# Patient Record
Sex: Male | Born: 1960 | Race: White | Hispanic: No | Marital: Married | Smoking: Never smoker
Health system: Southern US, Community
[De-identification: ages and names within clinical notes are randomized; demographics above are authoritative.]

## PROBLEM LIST (undated history)

## (undated) DIAGNOSIS — I639 Cerebral infarction, unspecified: Secondary | ICD-10-CM

## (undated) DIAGNOSIS — Z8719 Personal history of other diseases of the digestive system: Secondary | ICD-10-CM

## (undated) DIAGNOSIS — Z87442 Personal history of urinary calculi: Secondary | ICD-10-CM

## (undated) HISTORY — PX: MOUTH SURGERY: SHX715

## (undated) HISTORY — DX: Personal history of other diseases of the digestive system: Z87.19

## (undated) HISTORY — DX: Cerebral infarction, unspecified: I63.9

## (undated) HISTORY — PX: LITHOTRIPSY: SUR834

## (undated) HISTORY — DX: Personal history of urinary calculi: Z87.442

---

## 2000-04-11 ENCOUNTER — Encounter: Payer: Self-pay | Admitting: Urology

## 2000-04-11 ENCOUNTER — Encounter: Admission: RE | Admit: 2000-04-11 | Discharge: 2000-04-11 | Payer: Self-pay | Admitting: Urology

## 2000-04-29 ENCOUNTER — Encounter: Payer: Self-pay | Admitting: Urology

## 2000-04-29 ENCOUNTER — Encounter: Admission: RE | Admit: 2000-04-29 | Discharge: 2000-04-29 | Payer: Self-pay | Admitting: Urology

## 2000-04-30 ENCOUNTER — Ambulatory Visit (HOSPITAL_COMMUNITY): Admission: RE | Admit: 2000-04-30 | Discharge: 2000-04-30 | Payer: Self-pay | Admitting: Urology

## 2003-10-27 ENCOUNTER — Emergency Department (HOSPITAL_COMMUNITY): Admission: EM | Admit: 2003-10-27 | Discharge: 2003-10-27 | Payer: Self-pay | Admitting: Emergency Medicine

## 2007-02-26 ENCOUNTER — Encounter: Admission: RE | Admit: 2007-02-26 | Discharge: 2007-02-26 | Payer: Self-pay | Admitting: Internal Medicine

## 2011-01-05 NOTE — Op Note (Signed)
[facility]  Patient:    Alan, Dudley                        MRN: [id number] Proc. Date: 04/30/00 Adm. Date:  [id number] Disc. Date: [id number] Attending:  Londell Moh CC:         [name], M.D.   Operative Report  PREOPERATIVE DIAGNOSIS:  Distal left ureteral calculus.  POSTOPERATIVE DIAGNOSIS:  Distal left ureteral calculus.  OPERATION PERFORMED:  Cystoscopy, left retrograde with interpretation, left ureteroscopy, left basket extraction, left double-J catheter insertion.  SURGEON:  [name], M.D.  ANESTHESIA:  General.  COMPLICATIONS:  None.  DRAINS:  6 French x 26 cm double-J catheter.  INDICATIONS FOR PROCEDURE:  This 50 year old male has what appears to be a small stone in the distal left ureter causing high grade obstruction and pain. The patient is now to undergo ureteroscopic extraction.  She understands the risks and benefits of the procedure having had a previous procedure in the past and gave full and informed consent.  DESCRIPTION OF PROCEDURE:  After successful induction of general anesthesia, the patient was placed in the dorsal lithotomy position, prepped with Betadine and draped in the usual sterile fashion.  Cystoscopy was performed and the urethra was visualized in its entire extent and found to be normal.  Beyond the veru montanum the prostate was visualized.  It was not enlarged.  The bladder itself was carefully inspected.  It was free of any tumor or stones. Both uteral orifices were normal in configuration and location.  Attempted left retrograde study was unsuccessful because the stone was impacted right within the intramural ureter and the ureteral catheter could not be passed beyond that.  Attempts to pass this did raise a small flap of mucosal tissue. The ureteroscope was then inserted and passed into the ureteral orifice where the true lumen was identified.  A guide wire was passed up to the  kidney.  A ureteral catheter was passed over this and retrograde studies showed hydronephrosis and what appeared to be a filling defect in the distal ureter. The distal ureter was then dilated with a balloon dilator.  When this was taken down, the ureteroscope was reinserted and the stone was visualized.  The stone was extracted and will be sent for stone analysis.  The ureteroscope was reinserted and the entire ureter up to the ureteropelvic junction was visualized with no other stone material identified.  The ureteroscope was removed.  A sheath was passed over the guide wire for stabilization.  Using fluoroscopic control, a 6 [place] by 26 cm double-J catheter was passed up into the kidney with a string attached.  The cystoscope was then reinserted to visualize the stent which was well positioned and was further positioned by pulling on the string.  It coiled normally in the pelvis as well as within the bladder.  The bladder was drained.  The patient tolerated the procedure well and was taken to the recovery room in good condition.  He will be given a prescription for Tylox to take for pain, Uromax for any burning or spasms and Septra DS one daily.  He will be instructed to pull out the stent in 48 to 72 hours or return to office in follow-up in approximately two weeks. DD:  04/30/00 TD:  05/02/00 Job: [id number]

## 2014-04-06 ENCOUNTER — Encounter (INDEPENDENT_AMBULATORY_CARE_PROVIDER_SITE_OTHER): Payer: Self-pay

## 2014-04-06 ENCOUNTER — Ambulatory Visit
Admission: RE | Admit: 2014-04-06 | Discharge: 2014-04-06 | Disposition: A | Discharge status: Home / Self Care | Payer: 59 | Source: Ambulatory Visit | Attending: Internal Medicine | Admitting: Internal Medicine

## 2014-04-06 ENCOUNTER — Other Ambulatory Visit: Payer: Self-pay | Admitting: Internal Medicine

## 2014-04-06 DIAGNOSIS — G501 Atypical facial pain: Secondary | ICD-10-CM

## 2014-04-14 ENCOUNTER — Encounter: Payer: Self-pay | Admitting: Neurology

## 2014-04-15 ENCOUNTER — Ambulatory Visit (INDEPENDENT_AMBULATORY_CARE_PROVIDER_SITE_OTHER): Payer: 59 | Admitting: Neurology

## 2014-04-15 ENCOUNTER — Encounter: Payer: Self-pay | Admitting: Neurology

## 2014-04-15 VITALS — BP 132/82 | HR 64 | Ht 67.5 in | Wt 214.0 lb

## 2014-04-15 DIAGNOSIS — G501 Atypical facial pain: Secondary | ICD-10-CM | POA: Insufficient documentation

## 2014-04-15 MED ORDER — CARBAMAZEPINE 200 MG PO TABS: 100.0000 mg | ORAL_TABLET | Freq: Two times a day (BID) | ORAL | Status: DC

## 2014-04-15 MED ORDER — HYDROCODONE-ACETAMINOPHEN 5-325 MG PO TABS: 1.0000 | ORAL_TABLET | Freq: Four times a day (QID) | ORAL | Status: DC | PRN

## 2014-04-15 NOTE — Patient Instructions (Signed)
Overall you are doing fairly well but I do want to suggest a few things today:   Remember to drink plenty of fluid, eat healthy meals and do not skip any meals. Try to eat protein with a every meal and eat a healthy snack such as fruit or nuts in between meals. Try to keep a regular sleep-wake schedule and try to exercise daily, particularly in the form of walking, 20-30 minutes a day, if you can.   As far as your medications are concerned, I would like to suggest the following: 1)Please start Tegretol  (1/2 tab) twice a day  2)Please try to decrease the amount of Ibuprofen you are taking on a daily basis 3)Continue to use pain medication as needed for symptomatic relief  Please have some blood work checked today.  Follow up as needed. Please call us with any interim questions, concerns, problems, updates or refill requests.   My clinical assistant and will answer any of your questions and relay your messages to me and also relay most of my messages to you.   Our phone number is [phone]. We also have an after hours call service for urgent matters and there is a physician on-call for urgent questions. For any emergencies you know to call 911 or go to the nearest emergency room

## 2014-04-15 NOTE — Progress Notes (Signed)
[facility]    Provider:  Dr Janann Colonel Referring Provider: Izora Gala, MD Primary Care Physician:  Horton Finer, MD  CC:  Facial pain  HPI:  Alan Dudley is a 53 y.o. male here as a referral from Dr. Constance Holster for facial pain  Symptoms started around 3 weeks ago, involves the left side of his face. Started as some discomfort in his upper jaw. Got progressively worse. Went to see dentist, told everything was ok. Saw PCP, was given amoxicillin and tramadol with no benefit. Had CT head which showed mild sinusitis. Started on levaquin and hydrocodone for the pain. No improvement. Referred to ENT, told he does not have a sinus infection and was referred to neurology.   Notes symptoms are now occuring more frequently. Notes symptoms build slowly and then will get progressively worse, Will last 15 to 45 minutes, typically occuring 6 to 7 times a day. Described as a aching pain. Mainly located in left temporal, maxillary region, down to lower jaw, pain behind his left eye. No conjunctival injection, no rhinorrhea, no nasal congestion. Notes breathing in air causes symptoms to flare up. Worse with eating. No ear pain, no hearing loss.   No recent tick bites, no rashes.   Review of Systems: Out of a complete 14 system review, the patient complains of only the following symptoms, and all other reviewed systems are negative. + eye pain, headache  History   Social History  . Marital Status: Married    Spouse Name: Lattie Haw     Number of Children: 2  . Years of Education: 12+   Occupational History  .  [employer]   Social History Main Topics  . Smoking status: Never Smoker   . Smokeless tobacco: Never Used  . Alcohol Use: Yes     Comment: SOCIAL   . Drug Use: No  . Sexual Activity: Not on file   Other Topics Concern  . Not on file   Social History Narrative   Patient lives at home with wife.    Patient has 2 sons.    Patient has 2 years of college.    Patient is  right handed.   patient works at [employer]    Family History  Problem Relation Age of Onset  . Alzheimer's disease Father   . Heart block Father   . Hearing loss Mother   . Hypertension Mother   . Hypertension Father   . Migraines Son   . Stroke Father     Past Medical History  Diagnosis Date  . Personal history of other diseases of digestive system   . Personal history of urinary calculi     Past Surgical History  Procedure Laterality Date  . Lithotripsy    . Mouth surgery      Current Outpatient Prescriptions  Medication Sig Dispense Refill  . HYDROcodone-acetaminophen (NORCO/VICODIN) 5-325 MG per tablet Take 1 tablet by mouth every 6 (six) hours as needed for moderate pain (1-2 TABS BY MOUTH EVERY 6 HOURS AS NEEDED).      . Ibuprofen (MOTRIN PO) Take by mouth.      . Multiple Vitamin (MULTI-VITAMIN PO) Take by mouth.      . traMADol (ULTRAM) 50 MG tablet 50 mg.       No current facility-administered medications for this visit.    Allergies as of 04/15/2014 - Review Complete 04/15/2014  Allergen Reaction Noted  . Bacitracin  04/14/2014    Vitals: BP 132/82  Pulse 64  Ht 5' 7.5" (1.715 m)  Wt 214 lb (97.07 kg)  BMI 33.00 kg/m2 Last Weight:  Wt Readings from Last 1 Encounters:  04/15/14 214 lb (97.07 kg)   Last Height:   Ht Readings from Last 1 Encounters:  04/15/14 5' 7.5" (1.715 m)     Physical exam: Exam: Gen: NAD, conversant Eyes: anicteric sclerae, moist conjunctivae HENT: Atraumatic, oropharynx clear Neck: Trachea midline; supple,  Lungs: CTA, no wheezing, rales, rhonic                          CV: RRR, no MRG Abdomen: Soft, non-tender;  Extremities: No peripheral edema  Skin: Normal temperature, no rash,  Psych: Appropriate affect, pleasant  Neuro: MS: AA&Ox3, appropriately interactive, normal affect   Attention: WORLD backwards  Speech: fluent w/o paraphasic error  Memory: good recent and remote recall  CN: PERRL, unable to fully  visualize fundus due to pupil size, EOMI no nystagmus, no ptosis, sensation intact to LT V1-V3 bilat, face symmetric, no weakness, hearing grossly intact, palate elevates symmetrically, shoulder shrug 5/5 bilat,  tongue protrudes midline, no fasiculations noted.  Motor: normal bulk and tone Strength: 5/5  In all extremities  Coord: rapid alternating and point-to-point (FNF, HTS) movements intact.  Reflexes: symmetrical, bilat downgoing toes  Sens: LT intact in all extremities  Gait: posture, stance, stride and arm-swing normal. Tandem gait intact. Able to walk on heels and toes. Romberg absent.   Assessment:  After physical and neurologic examination, review of laboratory studies, imaging, neurophysiology testing and pre-existing records, assessment will be reviewed on the problem list.  Plan:  Treatment plan and additional workup will be reviewed under Problem List.  1)Atypical facial pain  53y/o gentleman presenting for initial evaluation of left sided facial pain. Unclear etiology of atypical facial pain. Would consider trigeminal neuralgia though duration of pain and slow progression to maximal severity is not typical. Unilateral onset and duration/frequency raises question of a trigeminal autonomic cephalgia, specifically paroxysmal hemicrania though he does not note any autonomic symptoms. Will check Lyme, ESR, CRP and ACE level. Can consider MRI/A brain if symptoms persist. Will start tegretol 133m BID, plan to titrate up as tolerated. Follow up once lab work completed.   PJim Like DO  [facility] [address] [address] Phone [phone] [phone]

## 2014-04-16 LAB — LYME, TOTAL AB TEST/REFLEX

## 2014-04-16 LAB — SEDIMENTATION RATE: Sed Rate: 2 mm/hr (ref 0–30)

## 2014-04-16 LAB — ANGIOTENSIN CONVERTING ENZYME: Angio Convert Enzyme: 28 U/L (ref 14–82)

## 2014-04-16 LAB — C-REACTIVE PROTEIN: CRP: 1.8 mg/L (ref 0.0–4.9)

## 2014-04-20 ENCOUNTER — Telehealth: Payer: Self-pay | Admitting: Neurology

## 2014-04-20 NOTE — Telephone Encounter (Signed)
Patient returning call to [facility] regarding lab results, please call patient back and advise.

## 2014-04-22 ENCOUNTER — Other Ambulatory Visit: Payer: Self-pay | Admitting: Neurology

## 2014-04-22 NOTE — Telephone Encounter (Signed)
Returned call. Discussed plan going forward. Patient has noted an excellent response to tegretol. Will continue on for 2 more weeks. If no further exacerbations will plan to taper off at that time.

## 2014-04-22 NOTE — Telephone Encounter (Signed)
Patient calling to state he received his results but he wants to know what the next step is, whether he needs a follow up visit, etc. Please call and advise.

## 2014-05-05 ENCOUNTER — Telehealth: Payer: Self-pay | Admitting: Neurology

## 2014-05-05 NOTE — Telephone Encounter (Signed)
Please have him go to 1/2 tablet ( ) nightly for 2 weeks and then discontinue. Thanks.

## 2014-05-05 NOTE — Telephone Encounter (Signed)
I called back and spoke with the patient.  Alan Dudley he has not had any exacerbations, and is doing quite well.  He would like to know if he can taper CBZ.  Please advise.  Thank you.

## 2014-05-05 NOTE — Telephone Encounter (Signed)
Patient calling as instructed by Dr. Hosie Poisson to discuss lowing dosage for carbamazepine (TEGRETOL) 200 MG tablet.  Please call anytime and may leave detailed message if not available.

## 2014-05-05 NOTE — Telephone Encounter (Signed)
I called back.  Relayed Dr Minus Breeding message.  He verbalized understanding and will proceed with taper.  Patient will call us back if anything further is needed.

## 2014-05-11 ENCOUNTER — Telehealth: Payer: Self-pay | Admitting: Neurology

## 2014-05-11 NOTE — Telephone Encounter (Signed)
Spoke to patient and he relayed that he has had a fever since last night, anywhere from 99.8 - 102 F .   He stated that he is now on one half a Tegretol daily and was told to call if he developed a fever.  He can be reached at [phone].

## 2014-05-11 NOTE — Telephone Encounter (Signed)
Returned call. Counseled patient that his fever appears to be viral and I do not suspect it is related to his tegretol. He will continue to taper off the tegretol.

## 2014-05-11 NOTE — Telephone Encounter (Signed)
Noted  

## 2014-06-18 ENCOUNTER — Telehealth: Payer: Self-pay | Admitting: Neurology

## 2014-06-18 NOTE — Telephone Encounter (Signed)
Spoke with patient to schedule follow up appointment, felt appointment wasn't needed at this time.  In the future if he needs to come back he would like to be assigned to Dr. Anne HahnWillis.

## 2014-12-27 ENCOUNTER — Inpatient Hospital Stay (HOSPITAL_COMMUNITY)
Admission: EM | Admit: 2014-12-27 | Discharge: 2014-12-30 | DRG: 066 | Disposition: A | Discharge status: Home / Self Care | Payer: BLUE CROSS/BLUE SHIELD | Attending: Neurology | Admitting: Neurology

## 2014-12-27 ENCOUNTER — Emergency Department (HOSPITAL_COMMUNITY): Payer: BLUE CROSS/BLUE SHIELD

## 2014-12-27 ENCOUNTER — Encounter (HOSPITAL_COMMUNITY): Payer: Self-pay | Admitting: Emergency Medicine

## 2014-12-27 ENCOUNTER — Inpatient Hospital Stay (HOSPITAL_COMMUNITY): Payer: BLUE CROSS/BLUE SHIELD

## 2014-12-27 DIAGNOSIS — E538 Deficiency of other specified B group vitamins: Secondary | ICD-10-CM | POA: Diagnosis not present

## 2014-12-27 DIAGNOSIS — Z823 Family history of stroke: Secondary | ICD-10-CM

## 2014-12-27 DIAGNOSIS — E785 Hyperlipidemia, unspecified: Secondary | ICD-10-CM | POA: Diagnosis present

## 2014-12-27 DIAGNOSIS — Z79899 Other long term (current) drug therapy: Secondary | ICD-10-CM

## 2014-12-27 DIAGNOSIS — E669 Obesity, unspecified: Secondary | ICD-10-CM | POA: Diagnosis present

## 2014-12-27 DIAGNOSIS — Z791 Long term (current) use of non-steroidal anti-inflammatories (NSAID): Secondary | ICD-10-CM | POA: Diagnosis not present

## 2014-12-27 DIAGNOSIS — I6789 Other cerebrovascular disease: Secondary | ICD-10-CM | POA: Diagnosis not present

## 2014-12-27 DIAGNOSIS — I613 Nontraumatic intracerebral hemorrhage in brain stem: Principal | ICD-10-CM | POA: Diagnosis present

## 2014-12-27 DIAGNOSIS — Z79891 Long term (current) use of opiate analgesic: Secondary | ICD-10-CM

## 2014-12-27 DIAGNOSIS — H532 Diplopia: Secondary | ICD-10-CM | POA: Diagnosis present

## 2014-12-27 DIAGNOSIS — Z8249 Family history of ischemic heart disease and other diseases of the circulatory system: Secondary | ICD-10-CM | POA: Diagnosis not present

## 2014-12-27 DIAGNOSIS — Z888 Allergy status to other drugs, medicaments and biological substances status: Secondary | ICD-10-CM

## 2014-12-27 DIAGNOSIS — Z6832 Body mass index (BMI) 32.0-32.9, adult: Secondary | ICD-10-CM

## 2014-12-27 DIAGNOSIS — I619 Nontraumatic intracerebral hemorrhage, unspecified: Secondary | ICD-10-CM | POA: Diagnosis present

## 2014-12-27 LAB — CBC WITH DIFFERENTIAL/PLATELET
Basophils Absolute: 0.1 K/uL (ref 0.0–0.1)
Basophils Relative: 1 % (ref 0–1)
Eosinophils Absolute: 0.1 K/uL (ref 0.0–0.7)
Eosinophils Relative: 2 % (ref 0–5)
HCT: 45.4 % (ref 39.0–52.0)
Hemoglobin: 15.5 g/dL (ref 13.0–17.0)
Lymphocytes Relative: 30 % (ref 12–46)
Lymphs Abs: 2 K/uL (ref 0.7–4.0)
MCH: 26.9 pg (ref 26.0–34.0)
MCHC: 34.1 g/dL (ref 30.0–36.0)
MCV: 78.8 fL (ref 78.0–100.0)
Monocytes Absolute: 0.6 K/uL (ref 0.1–1.0)
Monocytes Relative: 9 % (ref 3–12)
Neutro Abs: 3.9 K/uL (ref 1.7–7.7)
Neutrophils Relative %: 58 % (ref 43–77)
Platelets: 195 K/uL (ref 150–400)
RBC: 5.76 MIL/uL (ref 4.22–5.81)
RDW: 13.7 % (ref 11.5–15.5)
WBC: 6.7 K/uL (ref 4.0–10.5)

## 2014-12-27 LAB — COMPREHENSIVE METABOLIC PANEL
ALK PHOS: 60 U/L (ref 38–126)
ALT: 43 U/L (ref 17–63)
AST: 27 U/L (ref 15–41)
Albumin: 4 g/dL (ref 3.5–5.0)
Anion gap: 10 (ref 5–15)
BUN: 13 mg/dL (ref 6–20)
CO2: 23 mmol/L (ref 22–32)
Calcium: 9.2 mg/dL (ref 8.9–10.3)
Chloride: 108 mmol/L (ref 101–111)
Creatinine, Ser: 1.05 mg/dL (ref 0.61–1.24)
GLUCOSE: 100 mg/dL — AB (ref 70–99)
POTASSIUM: 3.6 mmol/L (ref 3.5–5.1)
SODIUM: 141 mmol/L (ref 135–145)
Total Bilirubin: 0.6 mg/dL (ref 0.3–1.2)
Total Protein: 6.7 g/dL (ref 6.5–8.1)

## 2014-12-27 LAB — GLUCOSE, CAPILLARY
GLUCOSE-CAPILLARY: 81 mg/dL (ref 70–99)
Glucose-Capillary: 88 mg/dL (ref 70–99)

## 2014-12-27 LAB — I-STAT TROPONIN, ED: Troponin i, poc: 0 ng/mL (ref 0.00–0.08)

## 2014-12-27 LAB — MRSA PCR SCREENING: MRSA by PCR: NEGATIVE

## 2014-12-27 MED ORDER — SODIUM CHLORIDE 0.9 % IV BOLUS (SEPSIS)
1000.0000 mL | Freq: Once | INTRAVENOUS | Status: AC
Start: 2014-12-27 — End: 2014-12-27
  Administered 2014-12-27: 1000 mL via INTRAVENOUS

## 2014-12-27 MED ORDER — ACETAMINOPHEN 325 MG PO TABS
650.0000 mg | ORAL_TABLET | ORAL | Status: DC | PRN
  Administered 2014-12-27 – 2014-12-30 (×11): 650 mg via ORAL
  Filled 2014-12-27 (×11): qty 2

## 2014-12-27 MED ORDER — STROKE: EARLY STAGES OF RECOVERY BOOK
Freq: Once | Status: AC
  Administered 2014-12-27: 17:00:00
  Filled 2014-12-27: qty 1

## 2014-12-27 MED ORDER — IOHEXOL 350 MG/ML SOLN
100.0000 mL | Freq: Once | INTRAVENOUS | Status: AC | PRN
  Administered 2014-12-27: 100 mL via INTRAVENOUS

## 2014-12-27 MED ORDER — ACETAMINOPHEN 650 MG RE SUPP: 650.0000 mg | RECTAL | Status: DC | PRN

## 2014-12-27 MED ORDER — SENNOSIDES-DOCUSATE SODIUM 8.6-50 MG PO TABS
1.0000 | ORAL_TABLET | Freq: Two times a day (BID) | ORAL | Status: DC
  Filled 2014-12-27 (×7): qty 1

## 2014-12-27 MED ORDER — LABETALOL HCL 5 MG/ML IV SOLN: 10.0000 mg | INTRAVENOUS | Status: DC | PRN

## 2014-12-27 MED ORDER — SODIUM CHLORIDE 0.9 % IV SOLN
INTRAVENOUS | Status: DC
  Administered 2014-12-27 – 2014-12-28 (×2): via INTRAVENOUS

## 2014-12-27 MED ORDER — PANTOPRAZOLE SODIUM 40 MG IV SOLR
40.0000 mg | Freq: Every day | INTRAVENOUS | Status: DC
  Administered 2014-12-28: 40 mg via INTRAVENOUS
  Filled 2014-12-27 (×3): qty 40

## 2014-12-27 NOTE — Consult Note (Deleted)
NEURO HOSPITALIST CONSULT NOTE    Reason for Consult: HA  HPI:                                                                                                                                          Alan Dudley is an 54 y.o. male with no known Migraine history or stroke history in the past. HE does admit to having the occasional tension HA brought on by stress. Today he awoke and noted he had a HA in the left occipital region of his head.  He took a Ibuprofen and went to take the kids to school. While he was driving he noted when he looked in the distance he was seeing horizontal diplopia. When looking closely his vision was normal. Currently he has a 3/10 HA that is constant. It is more located behind his left eye but not throbbing.  HE is having no other associated symptoms.   Past Medical History  Diagnosis Date  . Personal history of other diseases of digestive system   . Personal history of urinary calculi     Past Surgical History  Procedure Laterality Date  . Lithotripsy    . Mouth surgery      Family History  Problem Relation Age of Onset  . Alzheimer's disease Father   . Heart block Father   . Hearing loss Mother   . Hypertension Mother   . Hypertension Father   . Migraines Son   . Stroke Father      Social History:  reports that he has never smoked. He has never used smokeless tobacco. He reports that he drinks alcohol. He reports that he does not use illicit drugs.  Allergies  Allergen Reactions  . Bacitracin     MEDICATIONS:                                                                                                                     No current facility-administered medications for this encounter.   Current Outpatient Prescriptions  Medication Sig Dispense Refill  . Multiple Vitamin (MULTI-VITAMIN PO) Take by mouth.    . carbamazepine (TEGRETOL) 200 MG tablet Take 0.5 tablets (100 mg total) by mouth 2 (two) times daily. (Patient  not taking: Reported on 12/27/2014) 60 tablet  3  . HYDROcodone-acetaminophen (NORCO/VICODIN) 5-325 MG per tablet Take 1 tablet by mouth every 6 (six) hours as needed for moderate pain (1-2 TABS BY MOUTH EVERY 6 HOURS AS NEEDED). (Patient not taking: Reported on 12/27/2014) 15 tablet 0  . Ibuprofen (MOTRIN PO) Take by mouth.    . traMADol (ULTRAM) 50 MG tablet 50 mg.        ROS:                                                                                                                                       History obtained from the patient  General ROS: negative for - chills, fatigue, fever, night sweats, weight gain or weight loss Psychological ROS: negative for - behavioral disorder, hallucinations, memory difficulties, mood swings or suicidal ideation Ophthalmic ROS: negative for - blurry vision, double vision, eye pain or loss of vision ENT ROS: negative for - epistaxis, nasal discharge, oral lesions, sore throat, tinnitus or vertigo Allergy and Immunology ROS: negative for - hives or itchy/watery eyes Hematological and Lymphatic ROS: negative for - bleeding problems, bruising or swollen lymph nodes Endocrine ROS: negative for - galactorrhea, hair pattern changes, polydipsia/polyuria or temperature intolerance Respiratory ROS: negative for - cough, hemoptysis, shortness of breath or wheezing Cardiovascular ROS: negative for - chest pain, dyspnea on exertion, edema or irregular heartbeat Gastrointestinal ROS: negative for - abdominal pain, diarrhea, hematemesis, nausea/vomiting or stool incontinence Genito-Urinary ROS: negative for - dysuria, hematuria, incontinence or urinary frequency/urgency Musculoskeletal ROS: negative for - joint swelling or muscular weakness Neurological ROS: as noted in HPI Dermatological ROS: negative for rash and skin lesion changes   Blood pressure 128/75, pulse 75, temperature 98 F (36.7 C), temperature source Oral, resp. rate 20, SpO2 99 %.   Neurologic  Examination:                                                                                                      HEENT-  Normocephalic, no lesions, without obvious abnormality.  Normal external eye and conjunctiva.  Normal TM's bilaterally.  Normal auditory canals and external ears. Normal external nose, mucus membranes and septum.  Normal pharynx. Cardiovascular- S1, S2 normal, pulses palpable throughout   Lungs- chest clear, no wheezing, rales, normal symmetric air entry Abdomen- normal findings: bowel sounds normal Extremities- no edema Lymph-no adenopathy palpable Musculoskeletal-no joint tenderness, deformity or swelling Skin-warm and dry, no hyperpigmentation, vitiligo, or suspicious lesions  Neurological Examination Mental Status: Alert, oriented, thought content appropriate.  Speech fluent without evidence of aphasia.  Able to follow 3 step commands without difficulty. Cranial Nerves: II: Discs flat bilaterally; Visual fields grossly normal, pupils equal, round, reactive to light and accommodation III,IV, VI: ptosis not present, extra-ocular motions intact bilaterally V,VII: smile symmetric, facial light touch sensation normal bilaterally VIII: hearing normal bilaterally IX,X: uvula rises symmetrically XI: bilateral shoulder shrug XII: midline tongue extension Motor: Right : Upper extremity   5/5    Left:     Upper extremity   5/5  Lower extremity   5/5     Lower extremity   5/5 Tone and bulk:normal tone throughout; no atrophy noted Sensory: Pinprick and light touch intact throughout, bilaterally Deep Tendon Reflexes: 2+ and symmetric throughout Plantars: Right: downgoing   Left: downgoing Cerebellar: normal finger-to-nose, and normal heel-to-shin test Gait: normal gait and station      Lab Results: Basic Metabolic Panel:  Recent Labs Lab 12/27/14 1148  NA 141  K 3.6  CL 108  CO2 23  GLUCOSE 100*  BUN 13  CREATININE 1.05  CALCIUM 9.2    Liver Function  Tests:  Recent Labs Lab 12/27/14 1148  AST 27  ALT 43  ALKPHOS 60  BILITOT 0.6  PROT 6.7  ALBUMIN 4.0   No results for input(s): LIPASE, AMYLASE in the last 168 hours. No results for input(s): AMMONIA in the last 168 hours.  CBC:  Recent Labs Lab 12/27/14 1148  WBC 6.7  NEUTROABS 3.9  HGB 15.5  HCT 45.4  MCV 78.8  PLT 195    Cardiac Enzymes: No results for input(s): CKTOTAL, CKMB, CKMBINDEX, TROPONINI in the last 168 hours.  Lipid Panel: No results for input(s): CHOL, TRIG, HDL, CHOLHDL, VLDL, LDLCALC in the last 168 hours.  CBG: No results for input(s): GLUCAP in the last 168 hours.  Microbiology: No results found for this or any previous visit.  Coagulation Studies: No results for input(s): LABPROT, INR in the last 72 hours.  Imaging: No results found.     Assessment and plan per attending neurologist  Felicie Mornavid Quanetta Truss PA-C [facility] [phone]  12/27/2014, 1:20 PM   Assessment/Plan:

## 2014-12-27 NOTE — Progress Notes (Signed)
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 No signs of hydrocephalus or mass effect. Patient clinically stable. Called neurosurgery, Dr Jordan LikesPool,  to discuss findings. 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 Will need outpatient follow up. No acute surgical intervention needed.  Elspeth Choeter Denzel Etienne, DO [facility] [phone]  If 7pm- 7am, please page neurology on call as listed in AMION.

## 2014-12-27 NOTE — ED Provider Notes (Signed)
CSN: [id number]     Arrival date & time 12/27/14  1048 History   First MD Initiated Contact with Patient 12/27/14 1126     Chief Complaint  Patient presents with  . Headache  . Diplopia     (Consider location/radiation/quality/duration/timing/severity/associated sxs/prior Treatment) The history is provided by the patient.  Alan Dudley is a 54 y.o. male here presenting with headache, diplopia. He has been having intermittent headaches for several days. However this morning he woke up with worsening left posterior headache. He was driving to work and then noticed that he has been seeing double in the distance. Denies any numbness or weakness. He went to see his primary care doctor and was sent here for rule out aneurysm or dissection. Patient has no history of hypertension or cerebral aneurysms. Has history of trigeminal neuralgia and was on carbamazepine before.   Past Medical History  Diagnosis Date  . Personal history of other diseases of digestive system   . Personal history of urinary calculi    Past Surgical History  Procedure Laterality Date  . Lithotripsy    . Mouth surgery     Family History  Problem Relation Age of Onset  . Alzheimer's disease Father   . Heart block Father   . Hearing loss Mother   . Hypertension Mother   . Hypertension Father   . Migraines Son   . Stroke Father    History  Substance Use Topics  . Smoking status: Never Smoker   . Smokeless tobacco: Never Used  . Alcohol Use: Yes     Comment: SOCIAL     Review of Systems  Eyes: Positive for visual disturbance.  Neurological: Positive for headaches.  All other systems reviewed and are negative.     Allergies  Bacitracin  Home Medications   Prior to Admission medications   Medication Sig Start Date End Date Taking? Authorizing Provider  Multiple Vitamin (MULTI-VITAMIN PO) Take by mouth.   Yes Historical Provider, MD  carbamazepine (TEGRETOL) 200 MG tablet Take 0.5 tablets (100 mg  total) by mouth 2 (two) times daily. Patient not taking: Reported on 12/27/2014 04/15/14   Ramond MarrowPeter J Sumner, DO  HYDROcodone-acetaminophen (NORCO/VICODIN) 5-325 MG per tablet Take 1 tablet by mouth every 6 (six) hours as needed for moderate pain (1-2 TABS BY MOUTH EVERY 6 HOURS AS NEEDED). Patient not taking: Reported on 12/27/2014 04/15/14   Ramond MarrowPeter J Sumner, DO  Ibuprofen (MOTRIN PO) Take by mouth.    Historical Provider, MD  traMADol (ULTRAM) 50 MG tablet 50 mg. 04/01/14   Historical Provider, MD   BP 147/86 mmHg  Pulse 74  Temp(Src) 97.7 F (36.5 C) (Oral)  Resp 17  SpO2 97% Physical Exam  Constitutional: He is oriented to person, place, and time. He appears well-developed and well-nourished.  HENT:  Head: Normocephalic.  Mouth/Throat: Oropharynx is clear and moist.  Eyes: Pupils are equal, round, and reactive to light.  Slight esotropia R eye. Mild R nystagmus when looking right, ? Vertical nystagmus when looking up   Neck: Normal range of motion. Neck supple.  No obvious carotid bruit   Cardiovascular: Normal rate, regular rhythm and normal heart sounds.   Pulmonary/Chest: Effort normal and breath sounds normal. No respiratory distress. He has no wheezes. He has no rales.  Abdominal: Soft. Bowel sounds are normal. He exhibits no distension. There is no tenderness. There is no rebound and no guarding.  Musculoskeletal: Normal range of motion. He exhibits no edema or tenderness.  Neurological: He is alert and oriented to person, place, and time. No cranial nerve deficit. Coordination normal.  Nl strength throughout. CN 2-12 intact   Skin: Skin is warm and dry.  Psychiatric: He has a normal mood and affect. His behavior is normal. Judgment and thought content normal.  Nursing note and vitals reviewed.   ED Course  Procedures (including critical care time) Labs Review Labs Reviewed  COMPREHENSIVE METABOLIC PANEL - Abnormal; Notable for the following:    Glucose, Bld 100 (*)    All other  components within normal limits  CBC WITH DIFFERENTIAL/PLATELET  I-STAT TROPOININ, ED    CRITICAL CARE Performed by: Alan Dudley, Dechelle Attaway   Total critical care time: 30 min   Critical care time was exclusive of separately billable procedures and treating other patients.  Critical care was necessary to treat or prevent imminent or life-threatening deterioration.  Critical care was time spent personally by me on the following activities: development of treatment plan with patient and/or surrogate as well as nursing, discussions with consultants, evaluation of patient's response to treatment, examination of patient, obtaining history from patient or surrogate, ordering and performing treatments and interventions, ordering and review of laboratory studies, ordering and review of radiographic studies, pulse oximetry and re-evaluation of patient's condition.   Imaging Review Ct Angio Head W/cm &/or Wo Cm  12/27/2014   CLINICAL DATA:  Severe headache with visual disturbance starting earlier today.  EXAM: CT ANGIOGRAPHY HEAD AND NECK  TECHNIQUE: Multidetector CT imaging of the head and neck was performed using the standard protocol during bolus administration of intravenous contrast. Multiplanar CT image reconstructions and MIPs were obtained to evaluate the vascular anatomy. Carotid stenosis measurements (when applicable) are obtained utilizing NASCET criteria, using the distal internal carotid diameter as the denominator.  CONTRAST:  100mL OMNIPAQUE IOHEXOL 350 MG/ML SOLN  COMPARISON:  Head CT 02/26/2007  FINDINGS: CT HEAD  There is a 1 cm hemorrhage to the left midline at the junction of the pons and medulla. No other acute intracranial hemorrhage. No sign of subarachnoid blood. No intraventricular blood. The brain otherwise has a normal appearance. No evidence of a sellar aerated atrophy or old infarction. The calvarium is unremarkable. Sinuses, middle ears and mastoids are clear.  CTA NECK  Aortic arch: No  evidence of dissection or aneurysm. No calcification. Branching pattern is normal. Brachiocephalic vessel origins are poorly seen because of dense artifact related to venous contrast.  Right carotid system: Common carotid artery widely patent to the bifurcation. No atherosclerotic disease bifurcation. Cervical internal carotid artery is normal.  Left carotid system: Common carotid artery widely patent to the bifurcation. No atherosclerotic disease at the bifurcation. Cervical internal carotid artery is normal.  Vertebral arteries:Both vertebral artery origins widely patent. Both vertebral arteries are normal through the neck. No evidence of stenosis or dissection.  Skeleton: Ordinary spondylosis  Other neck: No mass or lymphadenopathy  CTA HEAD  Anterior circulation: Both internal carotid arteries widely patent through the siphon region. No stenosis. The anterior and middle cerebral vessels are normal without proximal stenosis, aneurysm or vascular malformation.  Posterior circulation: Both vertebral arteries are widely patent at the foramen magnum. No basilar stenosis. Superior cerebellar and posterior cerebral arteries are normal. No evidence of posterior circulation aneurysm or vascular malformation.  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 No subarachnoid blood. No abnormal vascular structures seen in that region.  CT angiography of the large and medium size vessels is normal.  Critical Value/emergent results were called by telephone at the time of interpretation on 12/27/2014 at 2:08 pm to Dr. Chaney Malling , who verbally acknowledged these results.   Electronically Signed   By: Paulina Fusi M.D.   On: 12/27/2014 14:10   Ct Angio Neck W/cm &/or Wo/cm  12/27/2014   CLINICAL DATA:  Severe headache with visual disturbance starting earlier today.  EXAM: CT ANGIOGRAPHY HEAD AND NECK   TECHNIQUE: Multidetector CT imaging of the head and neck was performed using the standard protocol during bolus administration of intravenous contrast. Multiplanar CT image reconstructions and MIPs were obtained to evaluate the vascular anatomy. Carotid stenosis measurements (when applicable) are obtained utilizing NASCET criteria, using the distal internal carotid diameter as the denominator.  CONTRAST:  OMNIPAQUE IOHEXOL 350 MG/ML SOLN  COMPARISON:  Head CT 02/26/2007  FINDINGS: CT HEAD  There is a 1 cm hemorrhage to the left midline at the junction of the pons and medulla. No other acute intracranial hemorrhage. No sign of subarachnoid blood. No intraventricular blood. The brain otherwise has a normal appearance. No evidence of a sellar aerated atrophy or old infarction. The calvarium is unremarkable. Sinuses, middle ears and mastoids are clear.  CTA NECK  Aortic arch: No evidence of dissection or aneurysm. No calcification. Branching pattern is normal. Brachiocephalic vessel origins are poorly seen because of dense artifact related to venous contrast.  Right carotid system: Common carotid artery widely patent to the bifurcation. No atherosclerotic disease bifurcation. Cervical internal carotid artery is normal.  Left carotid system: Common carotid artery widely patent to the bifurcation. No atherosclerotic disease at the bifurcation. Cervical internal carotid artery is normal.  Vertebral arteries:Both vertebral artery origins widely patent. Both vertebral arteries are normal through the neck. No evidence of stenosis or dissection.  Skeleton: Ordinary spondylosis  Other neck: No mass or lymphadenopathy  CTA HEAD  Anterior circulation: Both internal carotid arteries widely patent through the siphon region. No stenosis. The anterior and middle cerebral vessels are normal without proximal stenosis, aneurysm or vascular malformation.  Posterior circulation: Both vertebral arteries are widely patent at the  foramen magnum. No basilar stenosis. Superior cerebellar and posterior cerebral arteries are normal. No evidence of posterior circulation aneurysm or vascular malformation.  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 No subarachnoid blood. No abnormal vascular structures seen in that region. CT angiography of the large and medium size vessels is normal.  Critical Value/emergent results were called by telephone at the time of interpretation on 12/27/2014 at 2:08 pm to Dr. Chaney Malling , who verbally acknowledged these results.   Electronically Signed   By: Paulina Fusi M.D.   On: 12/27/2014 14:10     EKG Interpretation None      MDM   Final diagnoses:  Diplopia  Diplopia   LOVETT COFFIN is a 54 y.o. male here with headache, diplopia. Consider dissection vs aneurysm vs stroke. Will get CT angio. Will consult neuro.   11:50 AM Discussed with Dr. Amada Jupiter, who agrees with CT angio. Will see patient.   3:07 PM CT showed 1 cm hemorrhage or infarction L pontomedullary junction. BP 120-130s with no meds. Will admit to neuro ICU for monitoring.     Richardean Canal, MD 12/27/14 [phone]

## 2014-12-27 NOTE — ED Notes (Signed)
Neuro at bedside.

## 2014-12-27 NOTE — ED Notes (Signed)
Pt c/o HA upon waking this am with diplopia; pt sent from PCP for further eval

## 2014-12-27 NOTE — H&P (Addendum)
H&P      HPI:                                                                                                                                          Alan BonineBruce E Dudley is an 54 y.o. male with no known Migraine history or stroke history in the past. HE does admit to having the occasional tension HA brought on by stress. Today he awoke and noted he had a HA in the left occipital region of his head.  He took a Ibuprofen and went to take the kids to school. While he was driving he noted when he looked in the distance he was seeing horizontal diplopia. When looking closely his vision was normal. Currently he has a 3/10 HA that is constant. It is more located behind his left eye but not throbbing.  HE is having no other associated symptoms.    Date last known well: Date: 12/26/2014 Time last known well: Unable to determine tPA Given: No: ICH Modified Rankin: Rankin Score=0    Past Medical History  Diagnosis Date  . Personal history of other diseases of digestive system   . Personal history of urinary calculi     Past Surgical History  Procedure Laterality Date  . Lithotripsy    . Mouth surgery      Family History  Problem Relation Age of Onset  . Alzheimer's disease Father   . Heart block Father   . Hearing loss Mother   . Hypertension Mother   . Hypertension Father   . Migraines Son   . Stroke Father      Social History:  reports that he has never smoked. He has never used smokeless tobacco. He reports that he drinks alcohol. He reports that he does not use illicit drugs.  Allergies  Allergen Reactions  . Bacitracin     MEDICATIONS:                                                                                                                     No current facility-administered medications for this encounter.   Current Outpatient Prescriptions  Medication Sig Dispense Refill  . Multiple Vitamin (MULTI-VITAMIN PO) Take by mouth.    . carbamazepine  (TEGRETOL) 200 MG tablet Take 0.5 tablets (100 mg total) by mouth 2 (two) times daily. (Patient not taking: Reported on 12/27/2014) 60 tablet 3  .  HYDROcodone-acetaminophen (NORCO/VICODIN) 5-325 MG per tablet Take 1 tablet by mouth every 6 (six) hours as needed for moderate pain (1-2 TABS BY MOUTH EVERY 6 HOURS AS NEEDED). (Patient not taking: Reported on 12/27/2014) 15 tablet 0  . Ibuprofen (MOTRIN PO) Take by mouth.    . traMADol (ULTRAM) 50 MG tablet 50 mg.        ROS:                                                                                                                                       History obtained from the patient  General ROS: negative for - chills, fatigue, fever, night sweats, weight gain or weight loss Psychological ROS: negative for - behavioral disorder, hallucinations, memory difficulties, mood swings or suicidal ideation Ophthalmic ROS: negative for - blurry vision, double vision, eye pain or loss of vision ENT ROS: negative for - epistaxis, nasal discharge, oral lesions, sore throat, tinnitus or vertigo Allergy and Immunology ROS: negative for - hives or itchy/watery eyes Hematological and Lymphatic ROS: negative for - bleeding problems, bruising or swollen lymph nodes Endocrine ROS: negative for - galactorrhea, hair pattern changes, polydipsia/polyuria or temperature intolerance Respiratory ROS: negative for - cough, hemoptysis, shortness of breath or wheezing Cardiovascular ROS: negative for - chest pain, dyspnea on exertion, edema or irregular heartbeat Gastrointestinal ROS: negative for - abdominal pain, diarrhea, hematemesis, nausea/vomiting or stool incontinence Genito-Urinary ROS: negative for - dysuria, hematuria, incontinence or urinary frequency/urgency Musculoskeletal ROS: negative for - joint swelling or muscular weakness Neurological ROS: as noted in HPI Dermatological ROS: negative for rash and skin lesion changes   Blood pressure 128/75, pulse 75,  temperature 98 F (36.7 C), temperature source Oral, resp. rate 20, SpO2 99 %.   Neurologic Examination:                                                                                                      HEENT-  Normocephalic, no lesions, without obvious abnormality.  Normal external eye and conjunctiva.  Normal TM's bilaterally.  Normal auditory canals and external ears. Normal external nose, mucus membranes and septum.  Normal pharynx. Cardiovascular- S1, S2 normal, pulses palpable throughout   Lungs- chest clear, no wheezing, rales, normal symmetric air entry Abdomen- normal findings: bowel sounds normal Extremities- no edema Lymph-no adenopathy palpable Musculoskeletal-no joint tenderness, deformity or swelling Skin-warm and dry, no hyperpigmentation, vitiligo, or suspicious lesions  Neurological Examination Mental Status: Alert, oriented, thought content appropriate.  Speech fluent without  evidence of aphasia.  Able to follow 3 step commands without difficulty. Cranial Nerves: II: Discs flat bilaterally; Visual fields grossly normal, pupils equal, round, reactive to light and accommodation III,IV, VI: ptosis not present, extra-ocular motions intact bilaterally V,VII: smile symmetric, facial light touch sensation normal bilaterally VIII: hearing normal bilaterally IX,X: uvula rises symmetrically XI: bilateral shoulder shrug XII: midline tongue extension Motor: Right : Upper extremity   5/5    Left:     Upper extremity   5/5  Lower extremity   5/5     Lower extremity   5/5 Tone and bulk:normal tone throughout; no atrophy noted Sensory: Pinprick and light touch intact throughout, bilaterally Deep Tendon Reflexes: 2+ and symmetric throughout Plantars: Right: downgoing   Left: downgoing Cerebellar: normal finger-to-nose, and normal heel-to-shin test Gait: normal gait and station      Lab Results: Basic Metabolic Panel:  Recent Labs Lab 12/27/14 1148  NA 141  K 3.6   CL 108  CO2 23  GLUCOSE 100*  BUN 13  CREATININE 1.05  CALCIUM 9.2    Liver Function Tests:  Recent Labs Lab 12/27/14 1148  AST 27  ALT 43  ALKPHOS 60  BILITOT 0.6  PROT 6.7  ALBUMIN 4.0   No results for input(s): LIPASE, AMYLASE in the last 168 hours. No results for input(s): AMMONIA in the last 168 hours.  CBC:  Recent Labs Lab 12/27/14 1148  WBC 6.7  NEUTROABS 3.9  HGB 15.5  HCT 45.4  MCV 78.8  PLT 195    Cardiac Enzymes: No results for input(s): CKTOTAL, CKMB, CKMBINDEX, TROPONINI in the last 168 hours.  Lipid Panel: No results for input(s): CHOL, TRIG, HDL, CHOLHDL, VLDL, LDLCALC in the last 168 hours.  CBG: No results for input(s): GLUCAP in the last 168 hours.  Microbiology: No results found for this or any previous visit.  Coagulation Studies: No results for input(s): LABPROT, INR in the last 72 hours.  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 No subarachnoid blood. No abnormal vascular structures seen in that region. CT angiography of the large and medium size vessels is normal.  Critical Value/emergent results were called by telephone at the time of interpretation on 12/27/2014 at 2:08 pm to Dr. Chaney Malling , who verbally acknowledged these results.     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 Given that his exam is better than expected given the location of his hemorrhage, I do wonder if he has bled into a lesion such as a cavernoma. I would favor watching him in the ICU overnight. Given his minimal symptoms, I do not think that PT is needed, could consider OT eval for vision though could see if he still has symptoms tomorrow.   1) Admit to ICU 2) no antiplatelets or anticoagulants 3) blood pressure control with goal  systolic < 160 4) Frequent neuro checks 5) If symptoms worsen or there is decreased mental status, repeat stat head CT 6) MRI brain w/wo contrast 7) will hold on therapy evals given minimal symptoms.    This patient is critically ill and at significant risk of neurological worsening, death and care requires constant monitoring of vital signs, hemodynamics,respiratory and cardiac monitoring, neurological assessment, discussion with family, other specialists and medical decision making of high complexity. I spent 40 minutes of  neurocritical care time  in the care of  this patient.  Ritta Slot, MD [facility] [phone]  If 7pm- 7am, please page neurology on call as listed in AMION. 12/27/2014  5:00 PM

## 2014-12-28 ENCOUNTER — Encounter (HOSPITAL_COMMUNITY): Payer: Self-pay | Admitting: *Deleted

## 2014-12-28 ENCOUNTER — Inpatient Hospital Stay (HOSPITAL_COMMUNITY): Payer: BLUE CROSS/BLUE SHIELD

## 2014-12-28 ENCOUNTER — Ambulatory Visit (HOSPITAL_COMMUNITY): Payer: BLUE CROSS/BLUE SHIELD

## 2014-12-28 DIAGNOSIS — I6789 Other cerebrovascular disease: Secondary | ICD-10-CM

## 2014-12-28 LAB — LIPID PANEL
Cholesterol: 231 mg/dL — ABNORMAL HIGH (ref 0–200)
HDL: 33 mg/dL — AB (ref >40)
LDL CALC: UNDETERMINED mg/dL (ref 0–99)
Total CHOL/HDL Ratio: 7 RATIO
Triglycerides: 463 mg/dL — ABNORMAL HIGH (ref <150)
VLDL: UNDETERMINED mg/dL (ref 0–40)

## 2014-12-28 LAB — GLUCOSE, CAPILLARY: GLUCOSE-CAPILLARY: 93 mg/dL (ref 70–99)

## 2014-12-28 LAB — VITAMIN B12: Vitamin B-12: 267 pg/mL (ref 180–914)

## 2014-12-28 LAB — TSH: TSH: 3.021 u[IU]/mL (ref 0.350–4.500)

## 2014-12-28 LAB — T4, FREE: Free T4: 0.73 ng/dL (ref 0.61–1.12)

## 2014-12-28 MED ORDER — VITAMIN B-12 1000 MCG PO TABS
1000.0000 ug | ORAL_TABLET | Freq: Every day | ORAL | Status: DC
Start: 2014-12-29 — End: 2014-12-30
  Administered 2014-12-29 – 2014-12-30 (×2): 1000 ug via ORAL
  Filled 2014-12-28 (×2): qty 1

## 2014-12-28 MED ORDER — PERFLUTREN LIPID MICROSPHERE
1.0000 mL | INTRAVENOUS | Status: AC | PRN
  Administered 2014-12-28: 2 mL via INTRAVENOUS
  Filled 2014-12-28: qty 10

## 2014-12-28 MED ORDER — BISACODYL 10 MG RE SUPP
10.0000 mg | Freq: Once | RECTAL | Status: AC
  Administered 2014-12-28: 10 mg via RECTAL
  Filled 2014-12-28: qty 1

## 2014-12-28 NOTE — Progress Notes (Signed)
UR completed.  No HH needs anticipated but will continue to follow in case this need changes.   Carlyle LipaMichelle Ainslie Mazurek, RN BSN MHA CCM Trauma/Neuro ICU Case Manager [phone]

## 2014-12-28 NOTE — Progress Notes (Signed)
Occupational Therapy Evaluation Patient Details Name: Alan BonineBruce E Chittenden MRN: [id number] DOB: 1961-07-24 Today's Date: 12/28/2014    History of Present Illness 54 yo who presented with c/o horizontal diplopia. 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 Significant cerebellar tonsillar descent measuring 10-11 mm   Clinical Impression   PTA, pt independent with all ADL and mobility. Pt currently presents with horizontal diplopia in far gaze and L gaze only. 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 Using spot occlusion on L lens to reduce c/o horizontal diplopia. Educated pt on use of occlusion and began visual fusion exercises. Discussion with pt regarding return to driving - this is up to the discretion of the pt and physician.  Discussed possibility of using temporary prisms, however recommended pt have this discussion with his eye doctor due to vision/diplopia changing as pt recovers from his injury. Will plan to see again tomorrow.    Follow Up Recommendations  Supervision - Intermittent;Other (comment);No OT follow up (pt's opthalmologist if needed for temporary prisms)    Equipment Recommendations  None recommended by OT    Recommendations for Other Services       Precautions / Restrictions Precautions Precautions: Other (comment) (diplopia)      Mobility Bed Mobility Overal bed mobility: Independent                Transfers Overall transfer level: Independent                    Balance Overall balance assessment: No apparent balance deficits (not formally assessed)                                          ADL Overall ADL's : Needs assistance/impaired                                     Functional mobility during ADLs: Independent General ADL Comments: Pt able to complete bathing/dressing. Pt c/o horizontal diplopia with distant vision only. Not currently afecting basic ADL tasks. Able to  read and use phone appropriately. Questioning the ability to drive.     Vision Vision Assessment?: Yes Eye Alignment: Within Functional Limits Ocular Range of Motion: Restricted on the left Alignment/Gaze Preference: Within Defined Limits Tracking/Visual Pursuits: Decreased smoothness of horizontal tracking;Left eye does not track medially;Impaired - to be further tested in functional context (L eye tracks laterally, however, mild dysconjugate gaze note) Saccades: Decreased speed of saccadic movement Convergence: Within functional limits Visual Fields: No apparent deficits Diplopia Assessment: Present in far gaze;Only with left gaze;Disappears with one eye closed;Objects split side to side Additional Comments: Pt assessed using spot occlusion on L lens to occlude L central vision and decrease c/o diplopia. Educated pt/family on use of spot occlusion. also began visual "fusion" exercises in L horizontal and vertical gazes   Perception     Praxis      Pertinent Vitals/Pain Pain Assessment: No/denies pain     Hand Dominance Right   Extremity/Trunk Assessment Upper Extremity Assessment Upper Extremity Assessment: Overall WFL for tasks assessed   Lower Extremity Assessment Lower Extremity Assessment: Overall WFL for tasks assessed   Cervical / Trunk Assessment Cervical / Trunk Assessment: Normal   Communication Communication Communication: No difficulties   Cognition Arousal/Alertness: Awake/alert Behavior [facility]  for tasks assessed/performed Overall Cognitive Status: Within Functional Limits for tasks assessed                     General Comments       Exercises Exercises: Other exercises Other Exercises Other Exercises: tracking into L lateral gaze Other Exercises:  visual fusion exercises   Shoulder Instructions      Home Living Family/patient expects to be discharged to:: Private residence Living Arrangements: Spouse/significant  other Available Help at Discharge: Available PRN/intermittently Type of Home: House Home Access: Stairs to enter     Home Layout: Two level                          Prior Functioning/Environment Level of Independence: Independent             OT Diagnosis: Disturbance of vision   OT Problem List: Impaired vision/perception   OT Treatment/Interventions: Therapeutic exercise;Self-care/ADL training;Therapeutic activities;Visual/perceptual remediation/compensation;Patient/family education    OT Goals(Current goals can be found in the care plan section) Acute Rehab OT Goals Patient Stated Goal: to have normal vision OT Goal Formulation: With patient Time For Goal Achievement: 01/04/15 Potential to Achieve Goals: Good  OT Frequency: Min 3X/week   Barriers to D/C:            Co-evaluation              End of Session Nurse Communication: Other (comment) (use of spot occlusion)  Activity Tolerance: Patient tolerated treatment well Patient left: in chair;with call bell/phone within reach;with family/visitor present   Time: 1230-1255 OT Time Calculation (min): 25 min Charges:  OT General Charges $OT Visit: 1 Procedure OT Evaluation $Initial OT Evaluation Tier I: 1 Procedure OT Treatments $Therapeutic Activity: 8-22 mins G-Codes:    Gabrielle Wakeland,HILLARY 12/28/2014, 2:11 PM   [facility] Arlanda Shiplett, OTR/L  [phone] 12/28/2014

## 2014-12-28 NOTE — Progress Notes (Signed)
STROKE TEAM PROGRESS NOTE   SUBJECTIVE (INTERVAL HISTORY) His wife is at the bedside.  Overall he feels his condition is stable. Still complain of double vision with distal vision. Mild headache at back of head, tolerable. BP well controlled, no BP meds requirement. Will do stat CT to rule out hematoma expansion.   OBJECTIVE Temp:  [97.7 F (36.5 C)-98.5 F (36.9 C)] 98.5 F (36.9 C) (05/10 0754) Pulse Rate:  [48-83] 75 (05/10 0900) Cardiac Rhythm:  [-] Normal sinus rhythm (05/10 0800) Resp:  [13-20] 18 (05/10 0900) BP: (100-163)/(52-93) 143/75 mmHg (05/10 0900) SpO2:  [93 %-100 %] 96 % (05/10 0900) Weight:  [211 lb 6.7 oz (95.9 kg)] 211 lb 6.7 oz (95.9 kg) (05/09 1600)   Recent Labs Lab 12/27/14 1952 12/27/14 2323 12/28/14 0753  GLUCAP 81 88 93    Recent Labs Lab 12/27/14 1148  NA 141  K 3.6  CL 108  CO2 23  GLUCOSE 100*  BUN 13  CREATININE 1.05  CALCIUM 9.2    Recent Labs Lab 12/27/14 1148  AST 27  ALT 43  ALKPHOS 60  BILITOT 0.6  PROT 6.7  ALBUMIN 4.0    Recent Labs Lab 12/27/14 1148  WBC 6.7  NEUTROABS 3.9  HGB 15.5  HCT 45.4  MCV 78.8  PLT 195   No results for input(s): CKTOTAL, CKMB, CKMBINDEX, TROPONINI in the last 168 hours. No results for input(s): LABPROT, INR in the last 72 hours. No results for input(s): COLORURINE, LABSPEC, PHURINE, GLUCOSEU, HGBUR, BILIRUBINUR, KETONESUR, PROTEINUR, UROBILINOGEN, NITRITE, LEUKOCYTESUR in the last 72 hours.  Invalid input(s): APPERANCEUR  No results found for: CHOL, TRIG, HDL, CHOLHDL, VLDL, LDLCALC No results found for: HGBA1C No results found for: LABOPIA, COCAINSCRNUR, LABBENZ, AMPHETMU, THCU, LABBARB  No results for input(s): ETH in the last 168 hours.  I have personally reviewed the radiological images below and agree with the radiology interpretations.  Ct Angio Head and Neck W/cm &/or Wo Cm  12/27/2014   IMPRESSION: 1 cm hemorrhage or hemorrhagic infarction at the left pontomedullary  junction. No subarachnoid blood. No abnormal vascular structures seen in that region. CT angiography of the large and medium size vessels is normal.     Mr Laqueta JeanBrain W Wo Contrast  12/27/2014   IMPRESSION: 9 x 9 x 12 mm acute hemorrhage LEFT paramedian medulla. Marked susceptibility. Faint postcontrast enhancement. Favor brainstem hemorrhage related to an occult cerebral vascular malformation; see discussion above.  Significant cerebellar tonsillar descent measuring 10-11 mm. 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 7   2D Echocardiogram  pending  EKG  NSR  PHYSICAL EXAM  Temp:  [97.7 F (36.5 C)-98.5 F (36.9 C)] 98.5 F (36.9 C) (05/10 0754) Pulse Rate:  [48-83] 75 (05/10 0900) Resp:  [13-20] 18 (05/10 0900) BP: (100-163)/(52-93) 143/75 mmHg (05/10 0900) SpO2:  [93 %-100 %] 96 % (05/10 0900) Weight:  [211 lb 6.7 oz (95.9 kg)] 211 lb 6.7 oz (95.9 kg) (05/09 1600)  General - Well nourished, well developed, in no apparent distress.  Ophthalmologic - Sharp disc margins OU.  Cardiovascular - Regular rate and rhythm with no murmur.  Neck - supple, no carotid bruits  Mental Status -  Level of arousal and orientation to time, place, and person were intact. Language including expression, naming, repetition, comprehension was assessed and found intact.  Cranial Nerves II -  XII - II - Visual field intact OU. III, IV, VI - Extraocular movements exam showed mild left CN VI palsy resulting left gaze diplopia. V - Facial sensation intact bilaterally. VII - Facial movement intact bilaterally. VIII - Hearing & vestibular intact bilaterally. X - Palate elevates symmetrically. XI - Chin turning & shoulder shrug intact bilaterally. XII - Tongue protrusion intact.  Motor Strength - The patient's strength was normal in all  extremities and pronator drift was absent.  Bulk was normal and fasciculations were absent.   Motor Tone - Muscle tone was assessed at the neck and appendages and was normal.  Reflexes - The patient's reflexes were symmetrical in all extremities and he had no pathological reflexes.  Sensory - Light touch, temperature/pinprick were assessed and were symmetrical.    Coordination - The patient had normal movements in the hands with no ataxia or dysmetria.  Tremor was absent.  Gait and Station - deferred due to safety concerns.   ASSESSMENT/PLAN Alan Dudley is a 54 y.o. male with no significant history admitted for HA. CT found to have lower pontine bleeding. 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  Symptoms stable.    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   Other Active Problems    Other Pertinent History    Hospital day # 1  This patient is critically ill due to brainstem ICH and at significant risk of neurological worsening, death form re-bleeding, brain herniation, respiratory failure. This patient's care requires constant monitoring of vital signs, hemodynamics, respiratory and cardiac monitoring, review of multiple databases, neurological assessment, discussion with family, other specialists and medical decision making of high complexity. I spent 35  minutes of neurocritical care time in the care of this patient.   Marvel PlanJindong Woodie Degraffenreid, MD PhD Stroke Neurology 12/28/2014 9:31 AM    To contact Stroke Continuity provider, please refer to [email or web address]. After hours, contact General Neurology

## 2014-12-28 NOTE — Progress Notes (Addendum)
Echocardiogram 2D Echocardiogram with definity has been performed.  Dorothey BasemanReel, Shreena Baines M 12/28/2014, 3:27 PM

## 2014-12-29 DIAGNOSIS — E785 Hyperlipidemia, unspecified: Secondary | ICD-10-CM

## 2014-12-29 DIAGNOSIS — H532 Diplopia: Secondary | ICD-10-CM

## 2014-12-29 LAB — LIPID PANEL
Cholesterol: 227 mg/dL — ABNORMAL HIGH (ref 0–200)
HDL: 31 mg/dL — ABNORMAL LOW (ref >40)
LDL Cholesterol: UNDETERMINED mg/dL (ref 0–99)
Total CHOL/HDL Ratio: 7.3 RATIO
Triglycerides: 607 mg/dL — ABNORMAL HIGH (ref <150)
VLDL: UNDETERMINED mg/dL (ref 0–40)

## 2014-12-29 MED ORDER — GADOBENATE DIMEGLUMINE 529 MG/ML IV SOLN
20.0000 mL | Freq: Once | INTRAVENOUS | Status: AC | PRN
  Administered 2014-12-29: 20 mL via INTRAVENOUS

## 2014-12-29 MED ORDER — PANTOPRAZOLE SODIUM 40 MG PO TBEC
40.0000 mg | DELAYED_RELEASE_TABLET | Freq: Every day | ORAL | Status: DC
  Administered 2014-12-29 – 2014-12-30 (×2): 40 mg via ORAL
  Filled 2014-12-29 (×2): qty 1

## 2014-12-29 NOTE — Progress Notes (Addendum)
Patient arrived around 2100 accompanied by wife. Patient is A &O times 4 with no deficits. He is able to ambulate indapendantly. He was Ok'd to shower and has done so. Will continue to monitor.

## 2014-12-29 NOTE — Progress Notes (Signed)
Occupational Therapy Treatment Patient Details Name: Alan BonineBruce E Dudley MRN: [id number] DOB: [birth year] Today's Date: 12/29/2014    History of present illness 54 yo who presented with c/o horizontal diplopia. 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 Significant cerebellar tonsillar descent measuring 10-11 mm   OT comments  Pt continues to complain of horizontal diplopia in far gaze and L field gaze. Spot occlusion adjusted. Discussed recommendations for activities to address diplopia, in addition to options after D/C. Discussed pt's concerns regarding return to work and driving. Pt given written HEP and information on spot occlusion. Will continue to follow as needed.   Follow Up Recommendations  Supervision - Intermittent;Other (comment);No OT follow up  If diplopia does not resolve, recommend follow up with eye doctor   Equipment Recommendations  None recommended by OT    Recommendations for Other Services      Precautions / Restrictions Precautions Precautions: Other (comment) (diplopia)       Mobility Bed Mobility    independent              Transfers  independent                    Balance                                   ADL  Discussed return to work and patient's concerns regarding driving. Recommended for pt to gradually return to work and how to accommodate for diplopia in the workplace. Discussed return to driving and need to have diplopia resolved with use of prisms/taping. Discussed safety with driving. Pt verbalized understanding.                                               Vision Eye Alignment: Within Functional Limits Alignment/Gaze Preference: Within Defined Limits Ocular Range of Motion: Restricted on the left Tracking/Visual Pursuits: Decreased smoothness of horizontal tracking;Left eye does not track medially;Impaired - to be further tested in functional context Saccades:  Decreased speed of saccadic movement Convergence: Within functional limits Diplopia Assessment: Present in far gaze;Only with left gaze;Disappears with one eye closed;Objects split side to side   Additional Comments: Pt continues to demonstrate horizontal diplopia infar gaze and in L field. Occlusion adjusted. Apparentldysconjugate gaze with superior gaze and L gaze. Pt reports that occlusion helps. Educated pt on how spot occlusion allows for sensory input to the affected eye while diminishing the double image. also discussed use of a patch intermittently to rest eye and the need to alternate the patch every 2 hours. Reviewed eye "exercises" - ROM intoL field, saccades, pursuits and visual fusion. Pt able to return demonstrate.   Perception     Praxis      Cognition   Behavior During Therapy: WFL for tasks assessed/performed Overall Cognitive Status: Within Functional Limits for tasks assessed                                                 General Comments  Pt discussing concerns of having another "bleed" and "this situation being a wake up call". Pt apparently emotional at times.  Pertinent Vitals/ Pain       Pain Assessment: 0-10 Pain Score: 1  Pain Location: head Pain Descriptors / Indicators: Aching Pain Intervention(s): Limited activity within patient's tolerance                                                          Frequency Min 3X/week     Progress Toward Goals  OT Goals(current goals can now be found in the care plan section)  Progress towards OT goals: Progressing toward goals  Acute Rehab OT Goals Patient Stated Goal: to have normal vision OT Goal Formulation: With patient Time For Goal Achievement: 01/04/15 Potential to Achieve Goals: Good ADL Goals Pt/caregiver will Perform Home Exercise Program: With written HEP provided Additional ADL Goal #1: Pt will verbalize understanding of use of spot occlusion to  reduce diplopia and increase funcitonal independence      End of Session     Activity Tolerance Patient tolerated treatment well   Patient Left in chair;with call bell/phone within reach   Nurse Communication Mobility status;Other (comment) (need for decreasing diplopia)        Time: [id number] OT Time Calculation (min): 34 min  Charges: OT General Charges $OT Visit: 1 Procedure OT Treatments $Therapeutic Activity: 23-37 mins  Denell Cothern,HILLARY 12/29/2014, 4:49 PM   [facility] Morris Longenecker, OTR/L  [phone] 12/29/2014

## 2014-12-29 NOTE — Progress Notes (Signed)
STROKE TEAM PROGRESS NOTE   SUBJECTIVE (INTERVAL HISTORY) His wife is at the bedside.  Overall he feels his condition is stable. Still complain of double vision with distal vision. Mild headache at back of head, tolerable. BP well controlled, no BP meds requirement. Will do stat CT to rule out hematoma expansion.   OBJECTIVE Temp:  [98.2 F (36.8 C)-99 F (37.2 C)] 98.4 F (36.9 C) (05/11 0801) Pulse Rate:  [55-91] 83 (05/11 1000) Cardiac Rhythm:  [-] Sinus bradycardia;Normal sinus rhythm (05/11 0800) Resp:  [8-21] 19 (05/11 1000) BP: (99-151)/(48-96) 151/79 mmHg (05/11 1000) SpO2:  [94 %-98 %] 94 % (05/11 1000)   Recent Labs Lab 12/27/14 1952 12/27/14 2323 12/28/14 0753  GLUCAP 81 88 93    Recent Labs Lab 12/27/14 1148  NA 141  K 3.6  CL 108  CO2 23  GLUCOSE 100*  BUN 13  CREATININE 1.05  CALCIUM 9.2    Recent Labs Lab 12/27/14 1148  AST 27  ALT 43  ALKPHOS 60  BILITOT 0.6  PROT 6.7  ALBUMIN 4.0    Recent Labs Lab 12/27/14 1148  WBC 6.7  NEUTROABS 3.9  HGB 15.5  HCT 45.4  MCV 78.8  PLT 195   No results for input(s): CKTOTAL, CKMB, CKMBINDEX, TROPONINI in the last 168 hours. No results for input(s): LABPROT, INR in the last 72 hours. No results for input(s): COLORURINE, LABSPEC, PHURINE, GLUCOSEU, HGBUR, BILIRUBINUR, KETONESUR, PROTEINUR, UROBILINOGEN, NITRITE, LEUKOCYTESUR in the last 72 hours.  Invalid input(s): APPERANCEUR     Component Value Date/Time   CHOL 227* 12/29/2014 0220   TRIG 607* 12/29/2014 0220   HDL 31* 12/29/2014 0220   CHOLHDL 7.3 12/29/2014 0220   VLDL UNABLE TO CALCULATE IF TRIGLYCERIDE OVER 400 mg/dL 16/10/960405/06/2015 54090220   LDLCALC UNABLE TO CALCULATE IF TRIGLYCERIDE OVER 400 mg/dL 81/19/147805/06/2015 29560220   No results found for: HGBA1C No results found for: LABOPIA, COCAINSCRNUR, LABBENZ, AMPHETMU, THCU, LABBARB  No results for input(s): ETH in the last 168 hours.  I have personally reviewed the radiological images below and agree  with the radiology interpretations.  Ct Angio Head and Neck W/cm &/or Wo Cm  12/27/2014   IMPRESSION: 1 cm hemorrhage or hemorrhagic infarction at the left pontomedullary junction. No subarachnoid blood. No abnormal vascular structures seen in that region. CT angiography of the large and medium size vessels is normal.     Mr Laqueta JeanBrain W Wo Contrast  12/27/2014   IMPRESSION: 9 x 9 x 12 mm acute hemorrhage LEFT paramedian medulla. Marked susceptibility. Faint postcontrast enhancement. Favor brainstem hemorrhage related to an occult cerebral vascular malformation; see discussion above.  Significant cerebellar tonsillar descent measuring 10-11 mm. 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 7   2D Echocardiogram  - LVEF 65-70%, moderate concentric LVH, normal wall motion, diastolic dysfunction, indeterminate LV filling pressure.  EKG  NSR  PHYSICAL EXAM  Temp:  [98.2 F (36.8 C)-99 F (37.2 C)] 98.4 F (36.9 C) (05/11 0801) Pulse Rate:  [55-91] 83 (05/11 1000) Resp:  [8-21] 19 (05/11 1000) BP: (99-151)/(48-96) 151/79 mmHg (05/11 1000) SpO2:  [94 %-98 %] 94 % (05/11 1000)  General - Well nourished, well developed, in no apparent distress.  Ophthalmologic - Sharp disc margins OU.  Cardiovascular - Regular rate and rhythm with no murmur.  Neck - supple, no carotid bruits  Mental Status -  Level of arousal and orientation to time, place, and person were intact. Language including expression, naming, repetition, comprehension was assessed and found intact.  Cranial Nerves II - XII - II - Visual field intact OU. III, IV, VI - Extraocular movements exam showed mild left CN VI palsy resulting left gaze diplopia. V - Facial sensation intact bilaterally. VII - Facial movement intact bilaterally. VIII - Hearing & vestibular  intact bilaterally. X - Palate elevates symmetrically. XI - Chin turning & shoulder shrug intact bilaterally. XII - Tongue protrusion intact.  Motor Strength - The patient's strength was normal in all extremities and pronator drift was absent.  Bulk was normal and fasciculations were absent.   Motor Tone - Muscle tone was assessed at the neck and appendages and was normal.  Reflexes - The patient's reflexes were symmetrical in all extremities and he had no pathological reflexes.  Sensory - Light touch, temperature/pinprick were assessed and were symmetrical.    Coordination - The patient had normal movements in the hands with no ataxia or dysmetria.  Tremor was absent.  Gait and Station - deferred due to safety concerns.   ASSESSMENT/PLAN Mr. Fredricka BonineBruce E Foland is a 54 y.o. male with no significant history admitted for HA. CT found to have lower pontine bleeding. 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  Symptoms stable.    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   MRI  Small pontine ICH  CTA head and neck negative  CT repeat stable hematoma  2D Echo  unremarkable  LDL not able to calculate due to high TG  HgbA1c pending  SCDs for VTE prophylaxis  Diet regular Room service appropriate?: Yes; Fluid consistency:: Thin   no antithrombotic prior to admission, now on no antithrombotic  Therapy recommendations:  No OT needs  Disposition:  Transfer to floor  Hyperlipidemia  Home meds:  none   Currently on none  LDL not able to calculate, goal < 70  High TG   Will repeat in am with fast lipid panel  Other Stroke Risk Factors  Obesity, Body mass index is 32.15 kg/(m^2).   Other Active Problems    Other Pertinent History    Hospital day # 2   Marvel PlanJindong Rula Keniston, MD PhD Stroke Neurology 12/29/2014 12:03 PM    To contact Stroke Continuity provider, please refer to [email or web address]. After hours, contact General Neurology

## 2014-12-30 ENCOUNTER — Other Ambulatory Visit: Payer: Self-pay | Admitting: Neurology

## 2014-12-30 ENCOUNTER — Inpatient Hospital Stay (HOSPITAL_COMMUNITY): Payer: BLUE CROSS/BLUE SHIELD

## 2014-12-30 DIAGNOSIS — I613 Nontraumatic intracerebral hemorrhage in brain stem: Secondary | ICD-10-CM

## 2014-12-30 DIAGNOSIS — E785 Hyperlipidemia, unspecified: Secondary | ICD-10-CM

## 2014-12-30 DIAGNOSIS — E538 Deficiency of other specified B group vitamins: Secondary | ICD-10-CM

## 2014-12-30 DIAGNOSIS — E669 Obesity, unspecified: Secondary | ICD-10-CM

## 2014-12-30 LAB — LIPID PANEL
CHOL/HDL RATIO: 6.5 ratio
Cholesterol: 208 mg/dL — ABNORMAL HIGH (ref 0–200)
HDL: 32 mg/dL — AB (ref >40)
LDL CALC: UNDETERMINED mg/dL (ref 0–99)
TRIGLYCERIDES: 413 mg/dL — AB (ref <150)
VLDL: UNDETERMINED mg/dL (ref 0–40)

## 2014-12-30 MED ORDER — EZETIMIBE 10 MG PO TABS: 10.0000 mg | ORAL_TABLET | Freq: Every day | ORAL | Status: DC

## 2014-12-30 MED ORDER — ACETAMINOPHEN 325 MG PO TABS: 650.0000 mg | ORAL_TABLET | ORAL | Status: AC | PRN

## 2014-12-30 MED ORDER — CYANOCOBALAMIN 1000 MCG PO TABS: 1000.0000 ug | ORAL_TABLET | Freq: Every day | ORAL | Status: AC

## 2014-12-30 MED ORDER — ATORVASTATIN CALCIUM 10 MG PO TABS: 10.0000 mg | ORAL_TABLET | Freq: Every day | ORAL | Status: DC

## 2014-12-30 NOTE — Progress Notes (Signed)
Discharge orders received.  Discharge instructions and follow-up appointments reviewed with the patient.  VSS upon discharge.  IV removed and education complete.   Sondra ComeSilva, Areyana Leoni M, RN

## 2014-12-30 NOTE — Discharge Summary (Signed)
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  Past Medical History  Diagnosis Date  . Personal history of other diseases of digestive system   . Personal history of urinary calculi    Past Surgical History  Procedure Laterality Date  . Lithotripsy    . Mouth surgery        Medication List    STOP taking these medications        carbamazepine 200 MG tablet  Commonly known as:  TEGRETOL     HYDROcodone-acetaminophen 5-325 MG per tablet  Commonly known as:  NORCO/VICODIN     MOTRIN PO     traMADol 50 MG tablet  Commonly known as:  ULTRAM      TAKE these medications        acetaminophen 325 MG tablet  Commonly known as:  TYLENOL  Take 2 tablets (650 mg total) by mouth every 4 (four) hours as needed for mild pain (or temp > 99 F).     atorvastatin 10 MG tablet  Commonly known as:  LIPITOR  Take 1 tablet (10 mg total) by mouth daily.     cyanocobalamin 1000 MCG tablet  Take 1 tablet (1,000 mcg total) by mouth daily.     ezetimibe 10 MG tablet  Commonly known as:  ZETIA  Take 1 tablet (10 mg total) by mouth daily.     MULTI-VITAMIN PO  Take by mouth.        LABORATORY STUDIES CBC    Component Value Date/Time   WBC 6.7 12/27/2014 1148   RBC 5.76 12/27/2014 1148   HGB 15.5 12/27/2014 1148   HCT 45.4 12/27/2014 1148   PLT 195 12/27/2014 1148   MCV 78.8 12/27/2014 1148   MCH 26.9 12/27/2014 1148   MCHC 34.1 12/27/2014 1148   RDW 13.7 12/27/2014 1148   LYMPHSABS 2.0 12/27/2014  1148   MONOABS 0.6 12/27/2014 1148   EOSABS 0.1 12/27/2014 1148   BASOSABS 0.1 12/27/2014 1148   CMP    Component Value Date/Time   NA 141 12/27/2014 1148   K 3.6 12/27/2014 1148   CL 108 12/27/2014 1148   CO2 23 12/27/2014 1148   GLUCOSE 100* 12/27/2014 1148   BUN 13 12/27/2014 1148   CREATININE 1.05 12/27/2014 1148   CALCIUM 9.2 12/27/2014 1148   PROT 6.7 12/27/2014 1148   ALBUMIN 4.0 12/27/2014 1148   AST 27 12/27/2014 1148   ALT 43 12/27/2014 1148   ALKPHOS 60 12/27/2014 1148   BILITOT 0.6 12/27/2014 1148   GFRNONAA >60 12/27/2014 1148   GFRAA >60 12/27/2014 1148   COAGSNo results found for: INR, PROTIME Lipid Panel    Component Value Date/Time   CHOL 208* 12/30/2014 0428   TRIG 413* 12/30/2014 0428   HDL 32* 12/30/2014 0428   CHOLHDL 6.5 12/30/2014 0428   VLDL UNABLE TO CALCULATE IF TRIGLYCERIDE OVER 400 mg/dL 40/98/119105/07/2015 47820428   LDLCALC UNABLE TO CALCULATE IF TRIGLYCERIDE OVER 400 mg/dL 95/62/130805/07/2015 [id number]  HgbA1C No results found for: HGBA1C Cardiac Panel (last 3 results) No results for input(s): CKTOTAL, CKMB, TROPONINI, RELINDX in the last 72 hours. Urinalysis No results found for: COLORURINE, APPEARANCEUR, LABSPEC, PHURINE, GLUCOSEU, HGBUR, BILIRUBINUR, KETONESUR, PROTEINUR, UROBILINOGEN, NITRITE, LEUKOCYTESUR Urine Drug Screen No results found for: LABOPIA, COCAINSCRNUR, LABBENZ, AMPHETMU, THCU, LABBARB  Alcohol Level No results found for: [facility]   SIGNIFICANT DIAGNOSTIC STUDIES CT head 12/30/2014  1. 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 2. No new intracranial abnormality. 12/28/2014  1. Stable left pontomedullary hemorrhage measuring 11 x 7.5 mm on axial imaging. 2. Chiari 1 malformation. 3. No significant interval change.  Ct Angio Head and Neck W/cm &/or Wo Cm  12/27/2014 1 cm hemorrhage or hemorrhagic infarction at the left pontomedullary junction. No subarachnoid  blood. No abnormal vascular structures seen in that region. CT angiography of the large and medium size vessels is normal.   Mr Laqueta Jean Wo Contrast 12/27/2014 IMPRESSION: 9 x 9 x 12 mm acute hemorrhage LEFT paramedian medulla. Marked susceptibility. Faint postcontrast enhancement. Favor brainstem hemorrhage related to an occult cerebral vascular malformation; see discussion above. Significant cerebellar tonsillar descent measuring 10-11 mm. 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 7   2D Echocardiogram - LVEF 65-70%, moderate concentric LVH, normal wall motion, diastolic dysfunction, indeterminate LV filling pressure.  EKG NSR     HISTORY OF PRESENT ILLNESS Alan Dudley is an 54 y.o. male with no known Migraine history or stroke history in the past. HE does admit to having the occasional tension HA brought on by stress. Today he awoke and noted he had a HA in the left occipital region of his head. He took a Ibuprofen and went to take the kids to school. While he was driving he noted when he looked in the distance he was seeing horizontal diplopia. When looking closely his vision was normal. In the ED, he has a 3/10 HA that is constant. It is more located behind his left eye but not throbbing. HE is having no other associated symptoms. He was last known well 12/26/2014, time unknown. tPA was not considered secondary to ICH. Premorbid Modified Rankin Score=0    HOSPITAL COURSE Mr. Alan Dudley is a 54 y.o. male with no significant history admitted for HA. CT found to have lower pontine bleeding. 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 Symptoms stable.BP stable without BP meds. B12 low give supplement. Pt on discharge day, had slight worsening of horizontal diplopia and repeat CT done showed no change  of hematoma. Pt was discharged in good condition.   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   MRI Small pontine ICH  CTA head and neck negative  CT repeat x 2 stable hematoma  2D Echo unremarkable  LDL not able to calculate due to high TG  HgbA1c pending  no antithrombotic prior to admission  Therapy recommendations: No therapy needs  Disposition: discharge home with family  Hyperlipidemia  Home meds: none   LDL not able to calculate, goal < 70  High TG  repeat fast lipid panel similar to prior, chol 208 (was 227), TG 413 (was 607), HDL 32 (was 31), unable to calculate LDL  Will add lipitor 10 and zetia 10 at  discharge  Other Stroke Risk Factors  Obesity, Body mass index is 32.15 kg/(m^2). Patient currently undergoing weight loss plan. Plans to continue at discharge.  Relatively low B12 -  Put on B12 supplement   DISCHARGE EXAM Blood pressure 116/79, pulse 64, temperature 98.1 F (36.7 C), temperature source Oral, resp. rate 18, height 5\' 8"  (1.727 m), weight 95.9 kg (211 lb 6.7 oz), SpO2 99 %.  General - Well nourished, well developed, in no apparent distress.  Ophthalmologic - Sharp disc margins OU.  Cardiovascular - Regular rate and rhythm with no murmur.  Neck - supple, no carotid bruits  Mental Status -  Level of arousal and orientation to time, place, and person were intact. Language including expression, naming, repetition, comprehension was assessed and found intact.  Cranial Nerves II - XII - II - Visual field intact OU. III, IV, VI - Extraocular movements exam showed mild left CN VI palsy resulting left gaze diplopia. V - Facial sensation intact bilaterally. VII - Facial movement intact bilaterally. VIII - Hearing & vestibular intact bilaterally. X - Palate elevates symmetrically. XI - Chin turning & shoulder shrug intact bilaterally. XII - Tongue protrusion intact.  Motor Strength - The  patient's strength was normal in all extremities and pronator drift was absent. Bulk was normal and fasciculations were absent.  Motor Tone - Muscle tone was assessed at the neck and appendages and was normal.  Reflexes - The patient's reflexes were symmetrical in all extremities and he had no pathological reflexes.  Sensory - Light touch, temperature/pinprick were assessed and were symmetrical.   Coordination - The patient had normal movements in the hands with no ataxia or dysmetria. Tremor was absent.  Gait and Station - deferred due to safety concerns.   Discharge Diet   Diet regular Room service appropriate?: Yes; Fluid consistency:: Thin liquids  DISCHARGE PLAN  Disposition:  Home with family   Due to hemorrhage and risk of bleeding, do not take aspirin, aspirin-containing medications, or ibuprofen products. Tylenol ok.  Patient advised to avoid straining, ok to travel.   Recommend OP sleep study, can arrange at follow up  Recommend outpt OT follow up in 2 weeks.  Follow up CT head in 3 weeks   Follow-up Alan Dudley, KAREN, Alan Dudley in 2 weeks.  Follow-up with Dr. Marvel PlanJindong Noris Kulinski in 6 weeks.  40 minutes were spent preparing discharge.  Rhoderick MoodyBIBY,SHARON  [facility] See Amion for Pager information 12/30/2014 10:48 AM   I, the attending vascular neurologist, have personally obtained a history, examined the patient, evaluated laboratory data, individually viewed imaging studies and agree with radiology interpretations. I also discussed with pt's wife at bedside. Together with the NP/PA, we formulated the assessment and plan of care which reflects our mutual decision.  I have made any additions or clarifications directly to the above note and agree with the findings and plan as currently documented.    Alan PlanJindong Ezana Hubbert, MD PhD Stroke Neurology 12/30/2014 3:28 PM

## 2014-12-30 NOTE — Progress Notes (Signed)
Occupational Therapy Treatment Patient Details Name: Alan BonineBruce E Dudley MRN: [id number] DOB: 02-Sep-1960 Today's Date: 12/30/2014    History of present illness 54 yo who presented with c/o horizontal diplopia. 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 Significant cerebellar tonsillar descent measuring 10-11 mm   OT comments  Pt with complaints of worsening diplopia. Increased dysconjugate gaze today with abnormal lateral movement of L eye. Pt with complaints of headache. Dr. Roda ShuttersXu notified. Reviewed recommendations regarding management of diplopia.    Follow Up Recommendations  Supervision/Assistance - 24 hour;Supervision - Intermittent;Other (comment) (possible follow up with eye doctor if diplopia does not resolve)    Equipment Recommendations  None recommended by OT    Recommendations for Other Services      Precautions / Restrictions Precautions Precautions: Other (comment) (diplopia)       Mobility Bed Mobility Overal bed mobility: Independent                Transfers Overall transfer level: Independent                    Balance Overall balance assessment: No apparent balance deficits (not formally assessed)                                 ADL Overall ADL's : Needs assistance/impaired                                       General ADL Comments: Pt with worsening diplopia today. Discussed limiting screen time, which increases convergance; discussed refraining from driving until diplpia improves; discussed safety with mobilizing up/down steps and to hold onto handrail      Vision Eye Alignment: Impaired (comment) Alignment/Gaze Preference: Within Defined Limits Ocular Range of Motion: Restricted on the left Tracking/Visual Pursuits: Decreased smoothness of horizontal tracking;Left eye does not track medially;Impaired - to be further tested in functional context Saccades: Decreased speed of  saccadic movement Convergence: Within functional limits Diplopia Assessment: Only with left gaze;Present in far gaze   Additional Comments: Dysconjugate gaze more prominent today. Pt with increased complaints of horizontal diplopia - states distance between images is greater. Decreased abduction of L eye. Greater difficulty holding L eye in lateral gaze today. Begins to c/o L gaze diplopia closer to central field today.              Cognition   Behavior During Therapy: WFL for tasks assessed/performed Overall Cognitive Status: Within Functional Limits for tasks assessed                       Extremity/Trunk Assessment   WFL            Exercises Other Exercises Other Exercises: reviewed HEP and management of diplopia with use of spot occlusion and patching   Shoulder Instructions       General Comments  Pt appears anxious about visual changes    Pertinent Vitals/ Pain       Pain Assessment: 0-10 Pain Score: 2  Pain Location: head Pain Descriptors / Indicators: Aching Pain Intervention(s): Limited activity within patient's tolerance  Home Living  Alan Dudley, OTR/L  [phone] 5/12/2016Prior Functioning/Environment              Frequency Min 3X/week     Progress Toward Goals  OT Goals(current goals can now be found in the care plan section)  Progress towards OT goals: Progressing toward goals  Acute Rehab OT Goals Patient Stated Goal: to have normal vision OT Goal Formulation: With patient Time For Goal Achievement: 01/04/15 Potential to Achieve Goals: Good ADL Goals Pt/caregiver will Perform Home Exercise Program: With written HEP provided Additional ADL Goal #1: Pt will verbalize understanding of use of spot occlusion to reduce diplopia and increase funcitonal independence  Plan Discharge plan remains appropriate    Co-evaluation                 End of Session     Activity  Tolerance Patient tolerated treatment well   Patient Left in chair;with call bell/phone within reach;with family/visitor present   Nurse Communication Mobility status;Other (comment) (worsening of diplopia)        Time: 1150-1210 OT Time Calculation (min): 20 min  Charges: OT General Charges $OT Visit: 1 Procedure OT Treatments $Therapeutic Activity: 8-22 mins  Levy Wellman,Alan Dudley 12/30/2014, 1:17 PM

## 2014-12-31 ENCOUNTER — Emergency Department (HOSPITAL_COMMUNITY): Payer: BLUE CROSS/BLUE SHIELD

## 2014-12-31 ENCOUNTER — Inpatient Hospital Stay (HOSPITAL_COMMUNITY)
Admission: EM | Admit: 2014-12-31 | Discharge: 2015-01-02 | DRG: 064 | Disposition: A | Discharge status: Home / Self Care | Payer: BLUE CROSS/BLUE SHIELD | Attending: Neurology | Admitting: Neurology

## 2014-12-31 ENCOUNTER — Encounter (HOSPITAL_COMMUNITY): Payer: Self-pay | Admitting: Family Medicine

## 2014-12-31 DIAGNOSIS — H532 Diplopia: Secondary | ICD-10-CM | POA: Diagnosis present

## 2014-12-31 DIAGNOSIS — Z6831 Body mass index (BMI) 31.0-31.9, adult: Secondary | ICD-10-CM

## 2014-12-31 DIAGNOSIS — E6609 Other obesity due to excess calories: Secondary | ICD-10-CM | POA: Diagnosis present

## 2014-12-31 DIAGNOSIS — I613 Nontraumatic intracerebral hemorrhage in brain stem: Principal | ICD-10-CM

## 2014-12-31 DIAGNOSIS — R202 Paresthesia of skin: Secondary | ICD-10-CM | POA: Diagnosis present

## 2014-12-31 DIAGNOSIS — Z823 Family history of stroke: Secondary | ICD-10-CM | POA: Diagnosis not present

## 2014-12-31 DIAGNOSIS — E785 Hyperlipidemia, unspecified: Secondary | ICD-10-CM | POA: Diagnosis present

## 2014-12-31 DIAGNOSIS — R58 Hemorrhage, not elsewhere classified: Secondary | ICD-10-CM | POA: Diagnosis not present

## 2014-12-31 DIAGNOSIS — I619 Nontraumatic intracerebral hemorrhage, unspecified: Secondary | ICD-10-CM | POA: Diagnosis present

## 2014-12-31 DIAGNOSIS — R200 Anesthesia of skin: Secondary | ICD-10-CM | POA: Diagnosis not present

## 2014-12-31 DIAGNOSIS — Q283 Other malformations of cerebral vessels: Secondary | ICD-10-CM

## 2014-12-31 LAB — COMPREHENSIVE METABOLIC PANEL
ALT: 48 U/L (ref 17–63)
ANION GAP: 10 (ref 5–15)
AST: 29 U/L (ref 15–41)
Albumin: 4.3 g/dL (ref 3.5–5.0)
Alkaline Phosphatase: 63 U/L (ref 38–126)
BUN: 14 mg/dL (ref 6–20)
CALCIUM: 9.5 mg/dL (ref 8.9–10.3)
CO2: 25 mmol/L (ref 22–32)
Chloride: 104 mmol/L (ref 101–111)
Creatinine, Ser: 1.21 mg/dL (ref 0.61–1.24)
GFR calc Af Amer: >60 mL/min (ref >60)
GLUCOSE: 101 mg/dL — AB (ref 65–99)
Potassium: 3.7 mmol/L (ref 3.5–5.1)
Sodium: 139 mmol/L (ref 135–145)
TOTAL PROTEIN: 7.7 g/dL (ref 6.5–8.1)
Total Bilirubin: 0.5 mg/dL (ref 0.3–1.2)

## 2014-12-31 LAB — DIFFERENTIAL
Basophils Absolute: 0.1 10*3/uL (ref 0.0–0.1)
Basophils Relative: 1 % (ref 0–1)
EOS ABS: 0.1 10*3/uL (ref 0.0–0.7)
Eosinophils Relative: 1 % (ref 0–5)
LYMPHS ABS: 2.1 10*3/uL (ref 0.7–4.0)
LYMPHS PCT: 28 % (ref 12–46)
MONO ABS: 0.6 10*3/uL (ref 0.1–1.0)
MONOS PCT: 7 % (ref 3–12)
NEUTROS PCT: 63 % (ref 43–77)
Neutro Abs: 4.7 10*3/uL (ref 1.7–7.7)

## 2014-12-31 LAB — I-STAT CHEM 8, ED
BUN: 17 mg/dL (ref 6–20)
Calcium, Ion: 1.13 mmol/L (ref 1.12–1.23)
Chloride: 104 mmol/L (ref 101–111)
Creatinine, Ser: 1.2 mg/dL (ref 0.61–1.24)
Glucose, Bld: 101 mg/dL — ABNORMAL HIGH (ref 65–99)
HEMATOCRIT: 51 % (ref 39.0–52.0)
HEMOGLOBIN: 17.3 g/dL — AB (ref 13.0–17.0)
Potassium: 3.7 mmol/L (ref 3.5–5.1)
Sodium: 140 mmol/L (ref 135–145)
TCO2: 20 mmol/L (ref 0–100)

## 2014-12-31 LAB — CBC
HEMATOCRIT: 47.9 % (ref 39.0–52.0)
Hemoglobin: 16.7 g/dL (ref 13.0–17.0)
MCH: 27 pg (ref 26.0–34.0)
MCHC: 34.9 g/dL (ref 30.0–36.0)
MCV: 77.5 fL — ABNORMAL LOW (ref 78.0–100.0)
PLATELETS: 209 10*3/uL (ref 150–400)
RBC: 6.18 MIL/uL — AB (ref 4.22–5.81)
RDW: 13.5 % (ref 11.5–15.5)
WBC: 7.6 10*3/uL (ref 4.0–10.5)

## 2014-12-31 LAB — APTT: aPTT: 29 seconds (ref 24–37)

## 2014-12-31 LAB — I-STAT TROPONIN, ED: TROPONIN I, POC: 0 ng/mL (ref 0.00–0.08)

## 2014-12-31 LAB — HEMOGLOBIN A1C
Hgb A1c MFr Bld: 5.7 % — ABNORMAL HIGH (ref 4.8–5.6)
MEAN PLASMA GLUCOSE: 117 mg/dL

## 2014-12-31 LAB — PROTIME-INR
INR: 1 (ref 0.00–1.49)
PROTHROMBIN TIME: 13.3 s (ref 11.6–15.2)

## 2014-12-31 MED ORDER — STROKE: EARLY STAGES OF RECOVERY BOOK
Freq: Once | Status: AC
  Administered 2014-12-31: 1

## 2014-12-31 MED ORDER — ACETAMINOPHEN 650 MG RE SUPP: 650.0000 mg | RECTAL | Status: DC | PRN

## 2014-12-31 MED ORDER — ACETAMINOPHEN 325 MG PO TABS
650.0000 mg | ORAL_TABLET | ORAL | Status: DC | PRN
  Administered 2014-12-31 – 2015-01-02 (×5): 650 mg via ORAL
  Filled 2014-12-31 (×5): qty 2

## 2014-12-31 MED ORDER — LABETALOL HCL 5 MG/ML IV SOLN: 10.0000 mg | INTRAVENOUS | Status: DC | PRN

## 2014-12-31 NOTE — ED Notes (Signed)
Code stroke canceled at 1730 by neurology

## 2014-12-31 NOTE — H&P (Signed)
Neurology Consultation Reason for Consult: ICH Referring Physician: Denton LankSteinl, K  CC: Right arm numbness/heaviness  History is obtained from:patient, wife  HPI: Alan Dudley is a 54 y.o. male with a recent brainstem ICH with relatively minimal symptoms of headache and diploplia who returns for right sided numbness that started around 3:45pm. He is now 4 days post his initial hemorrhage and was doing well at home.   He initially had almost no diploplia but had some worsening prior to discharge.   He states that it is worse at his shoulder, but his whole arm feels "liek it is asleep, but not the pins and needles, just numb."   LKW: 5/09 tpa given?: no, ich   ROS: A 14 point ROS was performed and is negative except as noted in the HPI.   Past Medical History  Diagnosis Date  . Personal history of other diseases of digestive system   . Personal history of urinary calculi     Family History: alheimer's and stroke  Social History: Tob: never smoker  Exam: Current vital signs: BP 158/86 mmHg  Pulse 78  Temp(Src) 97.8 F (36.6 C) (Oral)  Resp 10  Wt 94.1 kg (207 lb 7.3 oz)  SpO2 98% Vital signs in last 24 hours: Temp:  [97.8 F (36.6 C)] 97.8 F (36.6 C) (05/13 1702) Pulse Rate:  [78-97] 78 (05/13 1730) Resp:  [10-18] 10 (05/13 1730) BP: (158-180)/(86-98) 158/86 mmHg (05/13 1730) SpO2:  [97 %-98 %] 98 % (05/13 1730) Weight:  [94.1 kg (207 lb 7.3 oz)] 94.1 kg (207 lb 7.3 oz) (05/13 1740)   Physical Exam  Constitutional: Appears well-developed and well-nourished.  Psych: Affect appropriate to situation Eyes: No scleral injection HENT: No OP obstrucion Head: Normocephalic.  Cardiovascular: Normal rate and regular rhythm.  Respiratory: Effort normal and breath sounds normal to anterior ascultation GI: Soft.  No distension. There is no tenderness.  Skin: WDI  Neuro: Mental Status: Patient is awake, alert, oriented to person, place, month, year, and  situation. Patient is able to give a clear and coherent history. No signs of aphasia or neglect Cranial Nerves: II: Visual Fields are full. Pupils are equal, round, and reactive to light.   III,IV, VI: He has a very mild dysconjugate gaze with diploplia.  V: Facial sensation is symmetric to temperature VII: Facial movement is symmetric.  VIII: hearing is intact to voice X: Uvula elevates symmetrically XI: Shoulder shrug is symmetric. XII: tongue is midline without atrophy or fasciculations.  Motor: Tone is normal. Bulk is normal. 5/5 strength was present in all four extremities. He possibly has a very mild right pronator drift.  Sensory: Sensation is symmetric to light touch and pin in the arms and legs. Cerebellar: FNF and HKS are intact bilaterally  I have reviewed labs in epic and the results pertinent to this consultation are: Cbc, BMP - unremarkable.   I have reviewed the images obtained:CT head - stable ICH  Impression: 54 yo M with ICH and mildly worsening symptoms. I suspect that his symptoms are due to edema. Given the location of the hemorrhage and danger if he does have any further swelling, I would favor admission so that he can be frequently assessed. I continue to suspect that he had a lesion such as cavernoma or DVM that he bled into given how minimal his symptoms were/are.   Recommendations: 1) Admit to tele(4N) 2) no antiplatelets or anticoagulants 3) blood pressure control with goal systolic < 160 4) Frequent neuro checks  5) If symptoms worsen or there is decreased mental status, repeat stat head CT  Ritta SlotMcNeill [name], MD [facility] [phone]  If 7pm- 7am, please page neurology on call as listed in AMION.

## 2014-12-31 NOTE — ED Notes (Signed)
Pt here for new onset right arm heaviness and numbness. Pt recent admission for intracerebral hemorrhage. sts started about 3:45 p.m.

## 2014-12-31 NOTE — ED Provider Notes (Signed)
MSE was initiated and I personally evaluated the patient and placed orders (if any) at  5:08 PM on Dec 31, 2014.  The patient appears stable so that the remainder of the MSE may be completed by another provider.  Alan Dudley is a 54 y.o. male complaining of right arm heaviness which she noticed 3:15 this afternoon while patient was writing checks. Patient was admitted for a hemorrhagic stroke 4 days ago. Patient denies pins and needles paresthesia, weakness, change in sensation. He states that the right arm just feels heavy and numb.  5 out of 5 strength to bilateral upper extremities at biceps, triceps and grip strength.. Patient's sensation is intact to differentiate pinprick and light touch to bilateral upper extremities  Discussed case with attending MD who recommends calling this out as a code stroke even though patient is not a TPA candidate.    Wynetta Emeryicole Kamesha Herne, PA-C 12/31/14 1714  Cathren LaineKevin Steinl, MD 01/06/15 [phone]

## 2014-12-31 NOTE — ED Notes (Signed)
Attempted report x1. 

## 2014-12-31 NOTE — ED Notes (Signed)
Transporting patient to new room assignment. 

## 2014-12-31 NOTE — Progress Notes (Signed)
Patient arrived to 4N25 at 1930 alert & oriented wife at bedside.  No c/o at this time.  Will continue to monitor.

## 2015-01-01 DIAGNOSIS — R58 Hemorrhage, not elsewhere classified: Secondary | ICD-10-CM

## 2015-01-01 MED ORDER — EZETIMIBE 10 MG PO TABS
10.0000 mg | ORAL_TABLET | Freq: Every day | ORAL | Status: DC
  Administered 2015-01-01 – 2015-01-02 (×2): 10 mg via ORAL
  Filled 2015-01-01 (×2): qty 1

## 2015-01-01 MED ORDER — ATORVASTATIN CALCIUM 10 MG PO TABS
10.0000 mg | ORAL_TABLET | Freq: Every day | ORAL | Status: DC
  Administered 2015-01-01: 10 mg via ORAL
  Filled 2015-01-01: qty 1

## 2015-01-01 NOTE — Progress Notes (Signed)
STROKE TEAM PROGRESS NOTE   HISTORY Alan Dudley is a 54 y.o. male with no known Migraine history or stroke history in the past. He does admit to having the occasional tension HA brought on by stress. Today he awoke and noted he had a HA in the left occipital region. He took an Ibuprofen and went to take the kids to school. While he was driving he noted when he looked in the distance he was seeing horizontal diplopia. When looking closely his vision was normal. Currently he has a 3/10 HA that is constant. It is more located behind his left eye but not throbbing. He is having no other associated symptoms.    SUBJECTIVE (INTERVAL HISTORY) No family members present. The patient reports that he is still experiencing diplopia. Dr. Pearlean BrownieSethi explained that this was probably evolution of his previous stroke. The patient is concerned about being discharged to soon. The plan is to repeat the CT tomorrow a.m. (Dr Roda ShuttersXu ordered an MRI/MRA on 12/30/2014 - not sure why it was to be performed). If there are no changes will probably discharge patient tomorrow afternoon. The patient has no need for PT and OT evaluations.   OBJECTIVE Temp:  [97.8 F (36.6 C)-98.4 F (36.9 C)] 98.2 F (36.8 C) (05/14 0600) Pulse Rate:  [60-97] 64 (05/14 0600) Cardiac Rhythm:  [-] Normal sinus rhythm (05/13 2000) Resp:  [9-21] 18 (05/14 0600) BP: (111-180)/(64-98) 130/68 mmHg (05/14 0600) SpO2:  [94 %-100 %] 98 % (05/14 0600) Weight:  [94.1 kg (207 lb 7.3 oz)] 94.1 kg (207 lb 7.3 oz) (05/13 2000)   Recent Labs Lab 12/27/14 1952 12/27/14 2323 12/28/14 0753  GLUCAP 81 88 93    Recent Labs Lab 12/27/14 1148 12/31/14 1713 12/31/14 1722  NA 141 139 140  K 3.6 3.7 3.7  CL 108 104 104  CO2 23 25  --   GLUCOSE 100* 101* 101*  BUN 13 14 17   CREATININE 1.05 1.21 1.20  CALCIUM 9.2 9.5  --     Recent Labs Lab 12/27/14 1148 12/31/14 1713  AST 27 29  ALT 43 48  ALKPHOS 60 63  BILITOT 0.6 0.5  PROT 6.7 7.7  ALBUMIN  4.0 4.3    Recent Labs Lab 12/27/14 1148 12/31/14 1713 12/31/14 1722  WBC 6.7 7.6  --   NEUTROABS 3.9 4.7  --   HGB 15.5 16.7 17.3*  HCT 45.4 47.9 51.0  MCV 78.8 77.5*  --   PLT 195 209  --    No results for input(s): CKTOTAL, CKMB, CKMBINDEX, TROPONINI in the last 168 hours.  Recent Labs  12/31/14 1713  LABPROT 13.3  INR 1.00   No results for input(s): COLORURINE, LABSPEC, PHURINE, GLUCOSEU, HGBUR, BILIRUBINUR, KETONESUR, PROTEINUR, UROBILINOGEN, NITRITE, LEUKOCYTESUR in the last 72 hours.  Invalid input(s): APPERANCEUR     Component Value Date/Time   CHOL 208* 12/30/2014 0428   TRIG 413* 12/30/2014 0428   HDL 32* 12/30/2014 0428   CHOLHDL 6.5 12/30/2014 0428   VLDL UNABLE TO CALCULATE IF TRIGLYCERIDE OVER 400 mg/dL 95/28/413205/07/2015 44010428   LDLCALC UNABLE TO CALCULATE IF TRIGLYCERIDE OVER 400 mg/dL 02/72/536605/07/2015 44030428   Lab Results  Component Value Date   HGBA1C 5.7* 12/28/2014   No results found for: LABOPIA, COCAINSCRNUR, LABBENZ, AMPHETMU, THCU, LABBARB  No results for input(s): ETH in the last 168 hours.    Ct Head Wo Contrast 12/31/2014    1. No interval change in subacute left pontine medullary hemorrhage.   2  No new hemorrhage or cortical infarction identified.      Ct Head Wo Contrast 12/30/2014    1. 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  2. No new intracranial abnormality.       PHYSICAL EXAM Pleasant middle aged caucasian male wearing eye strip on his eye glasses for diplopia.. . Afebrile. Head is nontraumatic. Neck is supple without bruit.    Cardiac exam no murmur or gallop. Lungs are clear to auscultation. Distal pulses are well felt.  Neurological Exam ;  Awake  Alert oriented x 3. Normal speech and language.eye movements full without nystagmus.Mild restriction of left lateral gaze with horizontal diplopia and saccadic dysmetria on left gaze.fundi were not  visualized. Vision acuity and fields appear normal. Hearing is normal. Palatal movements are normal. Face symmetric. Tongue midline. Normal strength, tone, reflexes and coordination except minimal  left finger to nose dysmetria. Normal sensation. Gait deferred.   ASSESSMENT/PLAN Alan Dudley is a 54 y.o. male with history of tension headaches and hyperlipidemia presenting with left occipital pain, diplopia, and right-sided numbness.  He did not receive IV t-PA due to hemorrhage.   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  MRI  pending  MRA  pending  Carotid Doppler not performed  2D Echo EF 65-70%. No cardiac source of emboli identified.  LDL could not be calculated secondary to triglycerides greater than 400.  HgbA1c - 5.7  SCDs for VTE prophylaxis  Diet regular Room service appropriate?: Yes; Fluid consistency:: Thin  no antithrombotic prior to admission, now on no antithrombotic secondary to hemorrhage.  Ongoing aggressive stroke risk factor management  Therapy recommendations: The patient was evaluated on 12/30/2014. No need for further evaluation.  Disposition: Pending    Hyperlipidemia  Home meds:  Lipitor and Zetia not resumed in hospital. Will restart.  LDL not calculated secondary to triglycerides greater than 400, goal < 70  Resume home medications  Continue statin at discharge   Other Stroke Risk Factors  ETOH use  Obesity, Body mass index is 31.55 kg/(m^2).   Family hx stroke (father)   Other Active Problems  Elevated triglycerides and total cholesterol.  PLAN  Repeat head CT tomorrow morning  No need for further occupational therapy or physical therapy evaluations  Discharge tomorrow if no new changes  Hospital day # 1  Delton Seeavid Rinehuls PA-C [facility] Pager [phone] 01/01/2015, 8:05 AM I have personally examined this patient, reviewed  notes, independently viewed imaging studies, participated in medical decision making and plan of care. I have made any additions or clarifications directly to the above note. Agree with note above. He has mild subjective arm heaviness possibly from mild edema around his brainstem hemorrhage and is at risk for neurological worsening but doubt hematoma expansion. Monitor for 1 more day and repeat head CT in am if stable likely DC home in am.  Delia HeadyPramod Sethi, MD Medical Director [facility] Pager: [phone] 01/01/2015 6:04 PM     To contact Stroke Continuity provider, please refer to [email or web address]. After hours, contact General Neurology

## 2015-01-02 ENCOUNTER — Inpatient Hospital Stay (HOSPITAL_COMMUNITY): Payer: BLUE CROSS/BLUE SHIELD

## 2015-01-02 DIAGNOSIS — I613 Nontraumatic intracerebral hemorrhage in brain stem: Secondary | ICD-10-CM

## 2015-01-02 NOTE — Progress Notes (Addendum)
Patient off floor for CT Scan at 0542.

## 2015-01-02 NOTE — Progress Notes (Signed)
Patient is being d/c home. D/c instruction given and patient verbalized understanding. 

## 2015-01-02 NOTE — Discharge Summary (Signed)
Stroke Discharge Summary  Patient ID: Alan Dudley   MRN: [id number]      DOB: 1960/11/24  Date of Admission: 12/31/2014 Date of Discharge: 01/02/2015  Attending Physician:  Micki Riley, MD, Stroke MD  Consulting Physician(s):     None  Patient's PCP:  Kristie Cowman, MD  DISCHARGE DIAGNOSIS: Subjective paresthesias RUE likely from recent brainstem hemorrhage but without new extensiont . 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  Active Problems:   ICH (intracerebral hemorrhage)   Hemorrhage  BMI: Body mass index is 31.55 kg/(m^2).  Past Medical History  Diagnosis Date  . Personal history of other diseases of digestive system   . Personal history of urinary calculi    Past Surgical History  Procedure Laterality Date  . Lithotripsy    . Mouth surgery        Medication List    TAKE these medications        acetaminophen 325 MG tablet  Commonly known as:  TYLENOL  Take 2 tablets (650 mg total) by mouth every 4 (four) hours as needed for mild pain (or temp > 99 F).     atorvastatin 10 MG tablet  Commonly known as:  LIPITOR  Take 1 tablet (10 mg total) by mouth daily.     cyanocobalamin 1000 MCG tablet  Take 1 tablet (1,000 mcg total) by mouth daily.     ezetimibe 10 MG tablet  Commonly known as:  ZETIA  Take 1 tablet (10 mg total) by mouth daily.     MULTI-VITAMIN PO  Take by mouth.        LABORATORY STUDIES CBC    Component Value Date/Time   WBC 7.6 12/31/2014 1713   RBC 6.18* 12/31/2014 1713   HGB 17.3* 12/31/2014 1722   HCT 51.0 12/31/2014 1722   PLT 209 12/31/2014 1713   MCV 77.5* 12/31/2014 1713   MCH 27.0 12/31/2014 1713   MCHC 34.9 12/31/2014 1713   RDW 13.5 12/31/2014 1713   LYMPHSABS 2.1 12/31/2014 1713   MONOABS 0.6 12/31/2014 1713   EOSABS 0.1 12/31/2014 1713   BASOSABS 0.1 12/31/2014 1713   CMP    Component Value Date/Time   NA 140 12/31/2014 1722   K 3.7 12/31/2014 1722   CL 104 12/31/2014  1722   CO2 25 12/31/2014 1713   GLUCOSE 101* 12/31/2014 1722   BUN 17 12/31/2014 1722   CREATININE 1.20 12/31/2014 1722   CALCIUM 9.5 12/31/2014 1713   PROT 7.7 12/31/2014 1713   ALBUMIN 4.3 12/31/2014 1713   AST 29 12/31/2014 1713   ALT 48 12/31/2014 1713   ALKPHOS 63 12/31/2014 1713   BILITOT 0.5 12/31/2014 1713   GFRNONAA >60 12/31/2014 1713   GFRAA >60 12/31/2014 1713   COAGS Lab Results  Component Value Date   INR 1.00 12/31/2014   Lipid Panel    Component Value Date/Time   CHOL 208* 12/30/2014 0428   TRIG 413* 12/30/2014 0428   HDL 32* 12/30/2014 0428   CHOLHDL 6.5 12/30/2014 0428   VLDL UNABLE TO CALCULATE IF TRIGLYCERIDE OVER 400 mg/dL 40/98/1191 4782   LDLCALC UNABLE TO CALCULATE IF TRIGLYCERIDE OVER 400 mg/dL 95/62/1308 6578   IONG2X  Lab Results  Component Value Date   HGBA1C 5.7* 12/28/2014   Cardiac Panel (last 3 results) No results for input(s): CKTOTAL, CKMB, TROPONINI, RELINDX in the last 72 hours. Urinalysis No results found for: COLORURINE, APPEARANCEUR, LABSPEC, PHURINE, GLUCOSEU, HGBUR, BILIRUBINUR, KETONESUR, PROTEINUR,  UROBILINOGEN, NITRITE, LEUKOCYTESUR Urine Drug Screen No results found for: LABOPIA, COCAINSCRNUR, LABBENZ, AMPHETMU, THCU, LABBARB  Alcohol Level No results found for: [employer]   SIGNIFICANT DIAGNOSTIC STUDIES   Ct Head Wo Contrast 01/02/2015 Evolving inferior pontine intraparenchymal hematoma, relatively unchanged. No obstructive hydrocephalus or new hemorrhage. 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   Ct Head Wo Contrast 12/31/2014  1. No interval change in subacute left pontine medullary hemorrhage.  2 No new hemorrhage or cortical infarction identified.     Ct Head Wo Contrast 12/30/2014  1. 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  2. No new intracranial abnormality.      HISTORY OF PRESENT  ILLNESS Fredricka BonineBruce E Dudley is an 54 y.o. male with no known Migraine history or stroke history in the past. HE does admit to having the occasional tension HA brought on by stress. Today he awoke and noted he had a HA in the left occipital region of his head. He took a Ibuprofen and went to take the kids to school. While he was driving he noted when he looked in the distance he was seeing horizontal diplopia. When looking closely his vision was normal. Currently he has a 3/10 HA that is constant. It is more located behind his left eye but not throbbing. HE is having no other associated symptoms.   HOSPITAL COURSE Mr. [name] is a 54 y.o. male with history of tension headaches and hyperlipidemia presenting with left occipital pain, diplopia, and right-sided numbness. He did not receive IV t-PA due to history of hemorrhage. He remained stable throughout his stay although he still had diplopia at time of discharge. He was instructed not to drive until the diplopia had resolved. He was to increase his activity very slowly as tolerated. He was not to return to work before May 23 and only if he felt ready.  Stroke: Dominant - left pontomedullary intra-axial hemorrhage   Resultant diplopia and right-sided numbness.  MRI not performed  MRAnot performed  Carotid Doppler not performed  2D Echo EF 65-70%. No cardiac source of emboli identified.  LDL could not be calculated secondary to triglycerides greater than 400.  HgbA1c - 5.7  SCDs for VTE prophylaxis  Diet regular Room service appropriate?: Yes; Fluid consistency:: Thin  no antithrombotic prior to admission, now on no antithrombotic secondary to hemorrhage.  Ongoing aggressive stroke risk factor management  Therapy recommendations: The patient was evaluated on 12/30/2014. No need for further evaluation.  Disposition: Discharge to home    Hyperlipidemia  Home meds: Lipitor and Zetia not resumed in hospital.   LDL not  calculated secondary to triglycerides greater than 400, goal < 70  Resume home medications  Continue statin at discharge   Other Stroke Risk Factors  ETOH use  Obesity, Body mass index is 31.55 kg/(m^2).   Family hx stroke (father)   Other Active Problems  Elevated triglycerides and total cholesterol.    DISCHARGE EXAM Blood pressure 144/87, pulse 85, temperature 98.5 F (36.9 C), temperature source Oral, resp. rate 18, height 5\' 8"  (1.727 m), weight 94.1 kg (207 lb 7.3 oz), SpO2 98 %.  Pleasant middle aged caucasian male wearing eye strip on his eye glasses for diplopia.. . Afebrile. Head is nontraumatic. Neck is supple without bruit. Cardiac exam no murmur or gallop. Lungs are clear to auscultation. Distal pulses are well felt.  Neurological Exam ;  Awake Alert  oriented x 3. Normal speech and language.eye movements full without nystagmus.Mild restriction of left lateral gaze with horizontal diplopia and saccadic dysmetria on left gaze.fundi were not visualized. Vision acuity and fields appear normal. Hearing is normal. Palatal movements are normal. Face symmetric. Tongue midline. Normal strength, tone, reflexes and coordination except minimal left finger to nose dysmetria. Normal sensation. Gait deferred.  Discharge Diet   Diet regular Room service appropriate?: Yes; Fluid consistency:: Thin liquids   DISCHARGE PLAN  Disposition:  Discharge to home in stable condition  no antithrombotic for secondary stroke prevention due to hemorrhage.  Follow-up Kristie CowmanSCHOOLER, KAREN, MD in 2 weeks.  Follow-up with Dr. Marvel PlanJindong Xu, Stroke Clinic as previously scheduled  No driving until diplopia resolves  Did not return to work before May 23  Gradually increase activity as tolerated  30 minutes were spent preparing discharge.  Delton Seeavid Rinehuls PA-C [facility] Pager [phone] 01/02/2015, 6:06 PM I have personally examined this patient, reviewed notes,  independently viewed imaging studies, participated in medical decision making and plan of care. I have made any additions or clarifications directly to the above note. Agree with note above. F/U with Dr Roda ShuttersXu as scheduled and repeat MRI in 3 weeks  Delia HeadyPramod Sethi, MD Medical Director [facility] Pager: [phone] 01/03/2015 7:55 AM

## 2015-01-02 NOTE — Progress Notes (Signed)
Patient returned from CT Scan.

## 2015-01-02 NOTE — Discharge Instructions (Signed)
1. Keep follow up appointments as scheduled with Dr Roda ShuttersXu and primary care MD. 2. Increase activity slowly as tolerated. Nothing strenuous for now. 3. No driving until double vision resolves 4. Return to work when you feel able. No sooner than May 23rd. 5. Call Dr Roda ShuttersXu with any questions or concerns.

## 2015-01-10 ENCOUNTER — Telehealth: Payer: Self-pay | Admitting: *Deleted

## 2015-01-10 NOTE — Telephone Encounter (Signed)
Thanks a lot, McClureMary.  Marvel PlanJindong Harvel Meskill, MD PhD Stroke Neurology 01/10/2015 5:54 PM

## 2015-01-10 NOTE — Telephone Encounter (Signed)
Per Dr Roda ShuttersXu, patient's FU with Dr Lucia GaskinsAhern cancelled and patient scheduled for 30 min hospital FU on March 03, 2015 with Dr Roda ShuttersXu. Left vm with detailed appt info, requested pt arrive at 3:30 pm for 4 pm appt. Stated that pt will be worked in early if possible, but will be seen by 4 pm for 30 min FU with Dr Roda ShuttersXu. Left this caller's name, call back number for any questions.

## 2015-01-10 NOTE — Telephone Encounter (Signed)
-----   Message from Marvel PlanJindong Xu, MD sent at 01/07/2015  6:16 PM EDT ----- Regarding: appointment Hi, Alan Dudley:  I saw this pt in hospital and wife insist that I see him for follow up in the office. Could you please cancel his appointment with Dr. Lucia GaskinsAhern and Derrick Raveloverbook with me sometime in July? (maybe late afternoon?) Thanks.  Marvel PlanJindong Xu, MD PhD Stroke Neurology 01/07/2015 6:18 PM

## 2015-01-13 ENCOUNTER — Other Ambulatory Visit: Payer: Self-pay | Admitting: Neurology

## 2015-01-13 DIAGNOSIS — I613 Nontraumatic intracerebral hemorrhage in brain stem: Secondary | ICD-10-CM

## 2015-01-14 ENCOUNTER — Telehealth: Payer: Self-pay | Admitting: *Deleted

## 2015-01-14 NOTE — Telephone Encounter (Signed)
Left vm requesting call back re: clarification for OT referral from Dr Roda ShuttersXu.  Patient has OT rpovider preference but unable to locate/contact his preferred provider. Left this caller's name, number.

## 2015-01-20 ENCOUNTER — Ambulatory Visit: Payer: BLUE CROSS/BLUE SHIELD | Attending: Neurology | Admitting: Occupational Therapy

## 2015-01-20 ENCOUNTER — Ambulatory Visit (INDEPENDENT_AMBULATORY_CARE_PROVIDER_SITE_OTHER): Payer: BLUE CROSS/BLUE SHIELD

## 2015-01-20 ENCOUNTER — Encounter: Payer: Self-pay | Admitting: Occupational Therapy

## 2015-01-20 DIAGNOSIS — H532 Diplopia: Secondary | ICD-10-CM | POA: Diagnosis not present

## 2015-01-20 DIAGNOSIS — H539 Unspecified visual disturbance: Secondary | ICD-10-CM | POA: Diagnosis present

## 2015-01-20 DIAGNOSIS — I613 Nontraumatic intracerebral hemorrhage in brain stem: Secondary | ICD-10-CM

## 2015-01-20 MED ORDER — GADOPENTETATE DIMEGLUMINE 469.01 MG/ML IV SOLN: 20.0000 mL | Freq: Once | INTRAVENOUS | Status: AC | PRN

## 2015-01-20 NOTE — Therapy (Signed)
[facility] [address] [place], [place], [address] Phone: [phone]   Fax:  [phone]  Occupational Therapy Evaluation  Patient Details  Name: Alan Dudley MRN: [id number] Date of Birth: 1960/12/11 Referring Provider:  Marvel PlanXu, Jindong, MD  Encounter Date: 01/20/2015      OT End of Session - 01/20/15 1716    Visit Number 1   Number of Visits 8   Date for OT Re-Evaluation 02/17/15   OT Start Time 1315   OT Stop Time 1400   OT Time Calculation (min) 45 min   Activity Tolerance Patient tolerated treatment well      Past Medical History  Diagnosis Date  . Personal history of other diseases of digestive system   . Personal history of urinary calculi     Past Surgical History  Procedure Laterality Date  . Lithotripsy    . Mouth surgery      There were no vitals filed for this visit.  Visit Diagnosis:  Diplopia - Plan: Ot plan of care cert/re-cert  Visual disturbance - Plan: Ot plan of care cert/re-cert      Subjective Assessment - 01/20/15 1324    Subjective  My vision was affected   Pertinent History see epic snapshot   Patient Stated Goals To be able to see normally again   Currently in Pain? No/denies           [facility] - 01/20/15 0001    Assessment   Diagnosis Brainstem CVA   Onset Date 12/27/14   Prior Therapy OT while in acute care   Precautions   Precautions None   Restrictions   Weight Bearing Restrictions No   Balance Screen   Has the patient fallen in the past 6 months No   Home  Environment   Family/patient expects to be discharged to: Private residence   Living Arrangements Spouse/significant other  young children   Type of Home House   Home Layout Two level   Bathroom Shower/Tub Tub/Shower unit   Prior Function   Level of Independence Independent   Vocation Full time employment   Vocation Requirements significant time on computer, organizing projects, works  with both softward and hardware, Systems analystsupervises staff   ADL   Eating/Feeding Independent   Grooming Modified independent  occassionally closes one eye  to complete tasks   Upper Body Bathing Independent   Lower Body Bathing Independent   Upper Body Dressing Independent   Lower Body Dressing Independent   Glass blower/designerToilet Tranfer Independent   Toileting - Cabin crewClothing Manipulation Independent   Tub/Shower Transfer Independent   IADL   Shopping Takes care of all shopping needs independently  has to close one eye to read labels   Light Housekeeping Maintains house alone or with occasional assistance   Meal Prep Plans, prepares and serves adequate meals independently  has to close one eye at times   [employer] Drives own vehicle  discussed risks of this with pt   Medication Management Is responsible for taking medication in correct dosages at correct time  has to close one eye to read labels   Financial Management Manages financial matters independently (budgets, writes checks, pays rent, bills goes to bank), collects and keeps track of income  has to close one eye to read computer; reports eye strain    Mobility   Mobility Status Independent   Written Expression   Dominant Hand Right   Vision - History   Baseline Vision Bifocals  wears all the time   Patient Visual Report Eye fatigue/eye pain/headache   Vision Assessment   Eye Alignment Impaired (comment)   Ocular Range of Motion Restricted on left  unable to abduct L eye greater than 30* with R eye occluded   Tracking/Visual Pursuits Left eye does not track laterally   Saccades Undershoots  with horizontal only   Visual Fields No apparent deficits   Diplopia Assessment Diappears with one eye closed  images are always side by side   Depth Perception WFL's   Activity Tolerance   Activity Tolerance Endurance does not limit participation in activity   Cognition   Overall Cognitive Status Within Functional Limits for tasks assessed    Sensation   Additional Comments Pt able to feel light touch, hot and cold however describes sensory changes "Like I have a second layer of skin that is too tight" in patches on L arm, L leg and L foot.   Coordination   Gross Motor Movements are Fluid and Coordinated Yes   Fine Motor Movements are Fluid and Coordinated Yes   ROM / Strength   AROM / PROM / Strength AROM;Strength  WFL's   Hand Function   Comment WFL's                              OT Long Term Goals - 01/20/15 1710    OT LONG TERM GOAL #1   Title Pt will be mod I with HEP - 02/17/2015   Status New   OT LONG TERM GOAL #2   Title Pt will verbalize understanding of compensatory strategies for reading and using computer   Status New   OT LONG TERM GOAL #3   Title Pt will verbalize understanding of difference between compensation vs remedial techniques.    Status New   OT LONG TERM GOAL #4   Title Pt will functionally be able to use computer or read for at least 20 minutes without eye fatigue or headache using strategies.   Status New               Plan - 01/20/15 1712    Clinical Impression Statement Pt is a 54 year old male s/p brainstem hemorrhage on Dec 27, 2014.  Pt presents today with the following deficits that impact his ability to return to work and participate in leisure and child care activities:  diplopia, impaired eye alignment, decreased ocular motor control, decreased abduction of left eye.  Pt will benefit fom  short course of skilled OT to address deficits and maximize return of visual skills.    Rehab Potential Good   OT Frequency 2x / week   OT Duration 4 weeks   OT Treatment/Interventions Self-care/ADL training;Therapeutic exercise;Therapeutic activities;Patient/family education;Visual/perceptual remediation/compensation   Plan review current HEP and adjust PRN   Consulted and Agree with Plan of Care Patient        Problem List Patient Active Problem List   Diagnosis  Date Noted  . Hemorrhage   . Hyperlipidemia 12/30/2014  . Obesity 12/30/2014  . ICH (intracerebral hemorrhage) 12/27/2014  . Atypical facial pain 04/15/2014    Norton Pastel, OTR/L 01/20/2015, 5:20 PM  [facility] [address] [place], [place], [address] Phone: [phone]   Fax:  [phone]

## 2015-01-24 ENCOUNTER — Encounter: Payer: Self-pay | Admitting: Occupational Therapy

## 2015-01-24 ENCOUNTER — Ambulatory Visit: Payer: BLUE CROSS/BLUE SHIELD | Admitting: Occupational Therapy

## 2015-01-24 DIAGNOSIS — H532 Diplopia: Secondary | ICD-10-CM

## 2015-01-24 DIAGNOSIS — H539 Unspecified visual disturbance: Secondary | ICD-10-CM | POA: Diagnosis not present

## 2015-01-24 NOTE — Patient Instructions (Signed)
Eye Exercises: n Do 20  Minutes 2x/day or break it up more if you get headaches.  1.  Completely occlude Right eye with clothe.  Work on moving eye into all quadrants using just the left eye. You can move the target and this works on increasing the ocular range.  Work on vertical, horizontal and diagonal. 2. Same as above however have Misty StanleyLisa move the target.  She should change up direction so you can't anticipate.  Go as fast as you can WHILE STILL MAINTAINING smooth pursuits.  Speed while change depending on where you are in fields. Work on vertical, horizontal and diagonals. 3.  Using both eyes, work on vertical, to the right and back to midline trying to maintain ONE image. Can go past midline if you can maintain image.  Will need to go slow. 4. Using the A's, smaller A to larger A, find spot that you see only one image.  Slowly move it out until you see double and then try and make it one. Hold for count of 5. Then move out again.  5. Find activities that you can do without any occlusion at all and go on automatic.

## 2015-01-24 NOTE — Therapy (Signed)
[facility] [address] [place], [place], [address] Phone: [phone]   Fax:  [phone]  Occupational Therapy Treatment  Patient Details  Name: Alan Dudley MRN: [id number] Date of Birth: [birth year] Referring Provider:  Kristie Cowman, MD  Encounter Date: 01/24/2015      OT End of Session - 01/24/15 1050    Visit Number 2   Number of Visits 8   Date for OT Re-Evaluation 02/17/15   OT Start Time 0932   OT Stop Time 1017   OT Time Calculation (min) 45 min   Activity Tolerance Patient tolerated treatment well      Past Medical History  Diagnosis Date  . Personal history of other diseases of digestive system   . Personal history of urinary calculi     Past Surgical History  Procedure Laterality Date  . Lithotripsy    . Mouth surgery      There were no vitals filed for this visit.  Visit Diagnosis:  Diplopia  Visual disturbance      Subjective Assessment - 01/24/15 0937    Subjective  I only had a slight headache over the weekend.   Pertinent History see epic snapshot   Patient Stated Goals To be able to see normally again   Currently in Pain? No/denies                      OT Treatments/Exercises (OP) - 01/24/15 0001    Visual/Perceptual Exercises   Other Exercises Addressed ocular motor exercises, convergence activities, discussion of remedial vs habituation vs compensation and discussed need to use all three as approaches.  Pt encouraged to find some tasks he can do without any occlusion to allow the brain to adapt to two images. Also recommended patching for task specificity (i.e computer use, reading) vs taping.  Pt able to use taping for driving successfully. Encouraged pt to obtain written clearance for driving from MD. Pt verbally in agreement.  See pt instructions for specific exercises.                OT Education - 01/24/15 1048    Education provided Yes   Person(s) Educated Patient   Methods Explanation;Demonstration;Verbal cues;Handout   Comprehension Verbalized understanding;Returned demonstration             OT Long Term Goals - 01/24/15 1048    OT LONG TERM GOAL #1   Title Pt will be mod I with HEP - 02/17/2015   Status On-going   OT LONG TERM GOAL #2   Title Pt will verbalize understanding of compensatory strategies for reading and using computer   Status On-going   OT LONG TERM GOAL #3   Title Pt will verbalize understanding of difference between compensation vs remedial techniques.    Status On-going   OT LONG TERM GOAL #4   Title Pt will functionally be able to use computer or read for at least 20 minutes without eye fatigue or headache using strategies.   Status On-going               Plan - 01/24/15 1048    Clinical Impression Statement Pt making progress towardy goals. Pt is very motivated to [employer] changes. He reports that MD verbally shared that his MRI looked excellent.   Rehab Potential Good   Clinical Impairments Affecting Rehab Potential Pt will benefit from skilled OT to address deficits outlined in eval.   OT Frequency 2x /  week   OT Duration 4 weeks   OT Treatment/Interventions Self-care/ADL training;Therapeutic exercise;Therapeutic activities;Patient/family education;Visual/perceptual remediation/compensation   Plan check modified HEP, work on Radiation protection practitionervisual skills   Consulted and Agree with Plan of Care Patient        Problem List Patient Active Problem List   Diagnosis Date Noted  . Hemorrhage   . Hyperlipidemia 12/30/2014  . Obesity 12/30/2014  . ICH (intracerebral hemorrhage) 12/27/2014  . 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

## 2015-01-26 ENCOUNTER — Other Ambulatory Visit: Payer: BLUE CROSS/BLUE SHIELD

## 2015-01-28 ENCOUNTER — Telehealth (HOSPITAL_COMMUNITY): Payer: Self-pay

## 2015-01-28 NOTE — Telephone Encounter (Signed)
Called pt to schedule angio, left voicemail for pt to call back to set up procedure. AW

## 2015-01-31 ENCOUNTER — Other Ambulatory Visit (HOSPITAL_COMMUNITY): Payer: Self-pay | Admitting: Interventional Radiology

## 2015-01-31 ENCOUNTER — Encounter: Payer: Self-pay | Admitting: Occupational Therapy

## 2015-01-31 ENCOUNTER — Other Ambulatory Visit: Payer: Self-pay | Admitting: Radiology

## 2015-01-31 ENCOUNTER — Ambulatory Visit: Payer: BLUE CROSS/BLUE SHIELD | Admitting: Occupational Therapy

## 2015-01-31 DIAGNOSIS — H539 Unspecified visual disturbance: Secondary | ICD-10-CM | POA: Diagnosis not present

## 2015-01-31 DIAGNOSIS — Q2730 Arteriovenous malformation, site unspecified: Secondary | ICD-10-CM

## 2015-01-31 DIAGNOSIS — H532 Diplopia: Secondary | ICD-10-CM

## 2015-01-31 NOTE — Therapy (Signed)
[facility] [address] [place], [place], [address] Phone: [phone]   Fax:  [phone]  Occupational Therapy Treatment  Patient Details  Name: Alan Dudley MRN: [id number] Date of Birth: 1961/05/05 Referring Provider:  Kristie Cowman, MD  Encounter Date: 01/31/2015      OT End of Session - 01/31/15 1306    Visit Number 3   Number of Visits 8   Date for OT Re-Evaluation 02/17/15   OT Start Time 1147   OT Stop Time 1217   OT Time Calculation (min) 30 min      Past Medical History  Diagnosis Date  . Personal history of other diseases of digestive system   . Personal history of urinary calculi     Past Surgical History  Procedure Laterality Date  . Lithotripsy    . Mouth surgery      There were no vitals filed for this visit.  Visit Diagnosis:  Diplopia  Visual disturbance      Subjective Assessment - 01/31/15 1301    Subjective  I did not have any headaches just eye fatigue; I think it is getting better   Pertinent History see epic snapshot   Patient Stated Goals To be able to see normally again   Currently in Pain? No/denies                      OT Treatments/Exercises (OP) - 01/31/15 0001    Exercises   Exercises Visual/Perceptual   Visual/Perceptual Exercises   Other Exercises Reviewed HEP and made small modifications based on pt feedback. Pt able to return demonstrate. Pt reports small improvement in diplopia in R  fields even with smooth pursuits to midline. Assessed smooth pursuits, saccades. Pt with improvement in both in vertical and movements to the right however still exhibits altered smooth pursuits in L visual fields, even with L eye occluded. Pt with improvement in abduction in L eye (was about 30* now about 40*).                     OT Long Term Goals - 01/31/15 1305    OT LONG TERM GOAL #1   Title Pt will be mod I with HEP - 02/17/2015   Status  Achieved   OT LONG TERM GOAL #2   Title Pt will verbalize understanding of compensatory strategies for reading and using computer   Status Achieved   OT LONG TERM GOAL #3   Title Pt will verbalize understanding of difference between compensation vs remedial techniques.    Status Achieved   OT LONG TERM GOAL #4   Title Pt will functionally be able to use computer or read for at least 20 minutes without eye fatigue or headache using strategies.   Status On-going               Plan - 01/31/15 1305    Clinical Impression Statement Pt make good progress toward goals. Pt to be placed on hold to allow pt to work on exercises and to allow the brain time to accomodate. Pt will return in 3 weeks. Pt in agreement.   Rehab Potential Good   Clinical Impairments Affecting Rehab Potential Pt will benefit from skilled OT to address deficits outlined in eval.   OT Frequency 2x / week   OT Duration 4 weeks   OT Treatment/Interventions Self-care/ADL training;Therapeutic exercise;Therapeutic activities;Patient/family education;Visual/perceptual remediation/compensation   Plan on hold for 3 weeks, reasses  progress, adjust HEP prn   Consulted and Agree with Plan of Care Patient        Problem List Patient Active Problem List   Diagnosis Date Noted  . Hemorrhage   . Hyperlipidemia 12/30/2014  . Obesity 12/30/2014  . ICH (intracerebral hemorrhage) 12/27/2014  . 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

## 2015-02-01 ENCOUNTER — Encounter (HOSPITAL_COMMUNITY): Payer: Self-pay

## 2015-02-01 ENCOUNTER — Ambulatory Visit (HOSPITAL_COMMUNITY)
Admission: RE | Admit: 2015-02-01 | Discharge: 2015-02-01 | Disposition: A | Discharge status: Home / Self Care | Payer: BLUE CROSS/BLUE SHIELD | Source: Ambulatory Visit | Attending: Interventional Radiology | Admitting: Interventional Radiology

## 2015-02-01 ENCOUNTER — Other Ambulatory Visit (HOSPITAL_COMMUNITY): Payer: Self-pay | Admitting: Interventional Radiology

## 2015-02-01 DIAGNOSIS — Z79899 Other long term (current) drug therapy: Secondary | ICD-10-CM | POA: Insufficient documentation

## 2015-02-01 DIAGNOSIS — Q2730 Arteriovenous malformation, site unspecified: Secondary | ICD-10-CM | POA: Insufficient documentation

## 2015-02-01 DIAGNOSIS — R930 Abnormal findings on diagnostic imaging of skull and head, not elsewhere classified: Secondary | ICD-10-CM | POA: Insufficient documentation

## 2015-02-01 DIAGNOSIS — Z8673 Personal history of transient ischemic attack (TIA), and cerebral infarction without residual deficits: Secondary | ICD-10-CM | POA: Diagnosis present

## 2015-02-01 LAB — BASIC METABOLIC PANEL
ANION GAP: 9 (ref 5–15)
BUN: 15 mg/dL (ref 6–20)
CALCIUM: 9.6 mg/dL (ref 8.9–10.3)
CO2: 24 mmol/L (ref 22–32)
Chloride: 105 mmol/L (ref 101–111)
Creatinine, Ser: 1.03 mg/dL (ref 0.61–1.24)
GFR calc non Af Amer: >60 mL/min (ref >60)
Glucose, Bld: 92 mg/dL (ref 65–99)
Potassium: 6.2 mmol/L (ref 3.5–5.1)
Sodium: 138 mmol/L (ref 135–145)

## 2015-02-01 LAB — CBC
HCT: 45.2 % (ref 39.0–52.0)
HEMOGLOBIN: 15.8 g/dL (ref 13.0–17.0)
MCH: 27.2 pg (ref 26.0–34.0)
MCHC: 35 g/dL (ref 30.0–36.0)
MCV: 77.9 fL — ABNORMAL LOW (ref 78.0–100.0)
Platelets: 189 10*3/uL (ref 150–400)
RBC: 5.8 MIL/uL (ref 4.22–5.81)
RDW: 13.5 % (ref 11.5–15.5)
WBC: 6.6 10*3/uL (ref 4.0–10.5)

## 2015-02-01 LAB — POTASSIUM: Potassium: 4 mmol/L (ref 3.5–5.1)

## 2015-02-01 LAB — PROTIME-INR
INR: 1.03 (ref 0.00–1.49)
Prothrombin Time: 13.7 seconds (ref 11.6–15.2)

## 2015-02-01 LAB — APTT: aPTT: 31 seconds (ref 24–37)

## 2015-02-01 MED ORDER — SODIUM CHLORIDE 0.9 % IV SOLN: INTRAVENOUS | Status: DC

## 2015-02-01 MED ORDER — IOHEXOL 300 MG/ML  SOLN
150.0000 mL | Freq: Once | INTRAMUSCULAR | Status: AC | PRN
  Administered 2015-02-01: 75 mL via INTRA_ARTERIAL

## 2015-02-01 MED ORDER — FENTANYL CITRATE (PF) 100 MCG/2ML IJ SOLN
INTRAMUSCULAR | Status: AC | PRN
  Administered 2015-02-01: 25 ug via INTRAVENOUS

## 2015-02-01 MED ORDER — FENTANYL CITRATE (PF) 100 MCG/2ML IJ SOLN
INTRAMUSCULAR | Status: AC
  Filled 2015-02-01: qty 2

## 2015-02-01 MED ORDER — SODIUM CHLORIDE 0.9 % IV SOLN
Freq: Once | INTRAVENOUS | Status: AC
  Administered 2015-02-01: 09:00:00 via INTRAVENOUS

## 2015-02-01 MED ORDER — MIDAZOLAM HCL 2 MG/2ML IJ SOLN
INTRAMUSCULAR | Status: AC | PRN
Start: 2015-02-01 — End: 2015-02-01
  Administered 2015-02-01: 1 mg via INTRAVENOUS

## 2015-02-01 MED ORDER — MIDAZOLAM HCL 2 MG/2ML IJ SOLN
INTRAMUSCULAR | Status: DC
Start: 2015-02-01 — End: 2015-02-02
  Filled 2015-02-01: qty 2

## 2015-02-01 MED ORDER — HEPARIN SODIUM (PORCINE) 1000 UNIT/ML IJ SOLN
INTRAMUSCULAR | Status: AC | PRN
  Administered 2015-02-01: 1000 [IU] via INTRAVENOUS

## 2015-02-01 NOTE — Sedation Documentation (Signed)
IR tech placing exoseal and holding pressure

## 2015-02-01 NOTE — Procedures (Signed)
S/P 4 vessel cerebral arteriogram. RT CFA approach. Findings. 1.No AV shunting to suggest AVM or dural fistula or of ,vascular blush ,aneurysm or nidus.noted. 2.No dissections noted. 3.Venous outflow WNLs

## 2015-02-01 NOTE — H&P (Signed)
Chief Complaint:  Recent brainstem hemorrhage  Referring Physician(s): Dr Jerel Shepherd  History of Present Illness: Alan Dudley is a 54 y.o. 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 Personal history of other diseases of digestive system   . Personal history of urinary calculi     Past Surgical History  Procedure Laterality Date  . Lithotripsy    . Mouth surgery      Allergies: Bacitracin  Medications: Prior to Admission medications   Medication Sig Start Date End Date Taking? Authorizing Provider  acetaminophen (TYLENOL) 325 MG tablet Take 2 tablets (650 mg total) by mouth every 4 (four) hours as needed for mild pain (or temp > 99 F). 12/30/14   Layne Benton, NP  atorvastatin (LIPITOR) 10 MG tablet Take 1 tablet (10 mg total) by mouth daily. 12/30/14 12/30/15  Layne Benton, NP  ezetimibe (ZETIA) 10 MG tablet Take 1 tablet (10 mg total) by mouth daily. 12/30/14 12/30/15  Layne Benton, NP  Multiple Vitamin (MULTI-VITAMIN PO) Take by mouth.    Historical Provider, MD  vitamin B-12 1000 MCG tablet Take 1 tablet (1,000 mcg total) by mouth daily. 12/30/14   Layne Benton, NP     Family History  Problem Relation Age of Onset  . Alzheimer's disease Father   . Heart block Father   . Hearing loss Mother   . Hypertension Mother   . Hypertension Father   . Migraines Son   . Stroke Father     History   Social History  . Marital Status: Married    Spouse Name: Misty Stanley   . Number of Children: 2  . Years of Education: 12+   Occupational History  .  [employer]   Social History Main Topics  . Smoking status:  Never Smoker   . Smokeless tobacco: Never Used  . Alcohol Use: Yes     Comment: SOCIAL   . Drug Use: No  . Sexual Activity: Not on file   Other Topics Concern  . None   Social History Narrative   Patient lives at home with wife.    Patient has 2 sons.    Patient has 2 years of college.    Patient is right handed.   patient works at [employer]     Review of Systems: A 12 point ROS discussed and pertinent positives are indicated in the HPI above.  All other systems are negative.  Review of Systems  Constitutional: Positive for activity change. Negative for fever and appetite change.  HENT: Negative for tinnitus, trouble swallowing and voice change.   Respiratory: Negative for cough and chest tightness.   Gastrointestinal: Negative for abdominal pain.  Musculoskeletal: Negative for neck pain and neck stiffness.  Neurological: Positive for numbness and headaches. Negative for dizziness, tremors, seizures, syncope, facial asymmetry, speech difficulty, weakness and light-headedness.  Psychiatric/Behavioral: Negative for behavioral problems and confusion.     Vital Signs: BP 147/82 mmHg  Pulse 73  Temp(Src) 98.1 F (36.7 C)  Resp 20  Ht  (1.727 m)  Wt 203 lb (92.08 kg)  BMI 30.87 kg/m2  SpO2 98%  Physical Exam  Constitutional: He is oriented to person, place, and time. He appears well-nourished.  Eyes: EOM are normal.  Neck: Neck supple.  Cardiovascular: Normal rate, regular rhythm and normal heart sounds.   No murmur heard. Pulmonary/Chest: Effort normal and breath sounds normal. He has no wheezes.  Abdominal: Soft. There is no tenderness.  Musculoskeletal: Normal range of motion.  Neurological: He is alert and oriented to person, place, and time.  Skin: Skin is warm and dry.  Psychiatric: He has a normal mood and affect. His behavior is normal. Judgment and thought content normal.  Nursing note and vitals reviewed.   Mallampati Score:  MD Evaluation Airway:  WNL Heart: WNL Abdomen: WNL Chest/ Lungs: WNL ASA  Classification: 2 Mallampati/Airway Score: One  Imaging: Mr Shirlee Latch Wo Contrast  01/24/2015   .  [facility] [address], [address] [place], [place] [address] [phone]  NEUROIMAGING REPORT   STUDY DATE: 01/20/2015 PATIENT NAME: Alan Dudley DOB: 10-28-1960 MRN: [id number]  EXAM: MR angiogram of the intracranial arteries  ORDERING CLINICIAN: Marvel Plan M.D. PhD CLINICAL HISTORY: 54 year old man with brainstem hemorrhage COMPARISON FILMS: MRI 01/20/2015 and 12/27/2014  TECHNIQUE: MR angiogram of the head was obtained utilizing 3D time of  flight sequences from below the vertebrobasilar junction up to the  intracranial vasculature without contrast.  Computerized reconstructions  were obtained. CONTRAST: None IMAGING SITE: [facility],  [address]  FINDINGS: The imaged extracranial and intracranial portions of the  internal carotid arteries appear normal. The middle cerebral arteries  appear normal. The A1 segments appear normal and normal anterior  communicating artery is noted. Distal right ACA has reduced flow. In the  posterior circulation, the vertebral arteries are co-dominant. No stenosis  is noted within the vertebral arteries and the basilar arteries. The  posterior cerebral arteries appear normal.  A 910 mm focus of heterogenous flow is noted just below and at the level  of the vertebral artery convergence in the upper medulla and a smaller 95  mm focus is noted in the lower pons.  A small linear vessel is noted  inferomedially to the larger focus and could represent a feeder artery to  a developmental anomaly or cavernoma complex.  This vessel, however, is  not clearly connected to any major intracranial arteries The smaller focus  in the lower pons does not appear to be associated with any artery.      01/24/2015     This is an abnormal MR angiogram a 9 x  10 mm focus in the  upper medulla and 95 mm focus in  the lower pons. The medullary focus  appears to have a possible feeder artery inferomedially. These likely  represent a developmental venous anomaly or a cavernoma complex.  Conventional angiography might better reveal the origin of this anomoly.    Incidental note is also made of decreased flow in the distal right ACA.    INTERPRETING PHYSICIAN:  Richard A. Epimenio Foot, MD, PhD Certified in  Neuroimaging by American Society of Neuroimaging     Mr Laqueta Jean [facility] Contrast  01/24/2015     [facility] [address], [address] [place], [place] [address] [phone]  NEUROIMAGING REPORT   STUDY DATE: 01/20/2015 PATIENT NAME: Alan Dudley DOB: [birth year] MRN: [id number]  EXAM: MRI Brain with and without contrast  ORDERING CLINICIAN: Marvel Plan M.D. PhD CLINICAL HISTORY: 54 year old man with intracranial hemorrhage COMPARISON FILMS: MRI of the brain 12/27/2014  TECHNIQUE: MRI of the brain with and without contrast was obtained  utilizing 5 mm axial slices with T1, T2, T2 flair, T2 star gradient echo  and diffusion weighted views.  T1 sagittal, T2 coronal and postcontrast  views in the axial and coronal plane were obtained. CONTRAST:  [employer] IMAGING SITE: [facility], [address]  FINDINGS: On sagittal images, the spinal cord is imaged caudally to C3C4  and is normal in caliber.  However, there appears to be a small syrinx  adjacent to the C3 vertebral body. 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   The  pituitary gland and optic chiasm appear normal.    Brain volume appears  normal.   The ventricles are normal in size and without distortion.  There  are no abnormal extra-axial collections of fluid.    There is a 12912 mm midline focus in the upper medulla extending into  the lower pons and smaller focus in the lower pons, more to the left.  These correspond to 2 regions of venous flow observed on the MR angiogram.  And likely  represent a developmental venous anomaly or cavernous  malformation. Prior to contrast, there is mild increased signal on T1  images posteriorly with hypointensity more anteriorly. The small focus is  hypointense on T1-weighted images, hyperintense on FLAIR images and poorly  observed on T2-weighted images. After the infusion of contrast material,  there is negligible enhancement of either focus.  Diffusion weighted and  Gradient echo heme weighted images show susceptibility within the foci.   The cerebellum appears normal.   The deep gray matter appears normal.  The  cerebral hemispheres appear normal.  The orbits appear normal.   The  VIIth/VIIIth nerve complex appears normal.  The mastoid air cells appear  normal.  A retention cyst is observed within the left maxillary sinus. The  other paranasal sinuses appear normal.  Flow voids are identified within  the major intracerebral arteries.     Diffusion weighted images are normal.   After the infusion of contrast  material, a normal enhancement pattern is noted.  Study was compared to an MRI of the brain dated 12/27/2014 at the time the  patient presented with diplopia.  The foci are similar though the larger  focus is more apparent on T1-weighted images on the current study,  possibly representing early subacute intracellular methemoglobin    01/24/2015    This is an abnormal MRI of the brain with and without  contrast showing abnormal signal in the upper medulla and lower pons  consistent with a developmental venous anomaly or cavernous malformation..    Since the prior MRI dated 12/27/2014, there has been signal changes  consistent with the presence of subacute intracellular methemoglobin and  evolution of hemorrhage associated with the AVM.   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      INTERPRETING PHYSICIAN:  Richard A. Epimenio Foot, MD, PhD Certified in  Neuroimaging by American  Society of Neuroimaging     Labs:  CBC:  Recent Labs  12/27/14 1148 12/31/14 1713 12/31/14 1722  WBC 6.7 7.6  --   HGB 15.5 16.7 17.3*  HCT 45.4 47.9 51.0  PLT 195 209  --     COAGS:  Recent Labs  12/31/14 1713  INR 1.00  APTT 29    BMP:  Recent Labs  12/27/14 1148 12/31/14 1713 12/31/14 1722  NA 141 139 140  K 3.6 3.7 3.7  CL 108 104 104  CO2 23 25  --   GLUCOSE 100* 101* 101*  BUN 13 14 17   CALCIUM 9.2 9.5  --   CREATININE 1.05 1.21 1.20  GFRNONAA >60 >60  --   GFRAA >60 >60  --     LIVER FUNCTION TESTS:  Recent Labs  12/27/14 1148 12/31/14 1713  BILITOT 0.6 0.5  AST 27 29  ALT 43 48  ALKPHOS 60 63  PROT 6.7 7.7  ALBUMIN 4.0 4.3    TUMOR MARKERS: No results for input(s): AFPTM, CEA, CA199, CHROMGRNA in the last 8760 hours.  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 All of the patient's questions were answered, patient is agreeable to proceed. Consent signed and in chart.   Thank you for this interesting consult.  I greatly enjoyed meeting Alan Dudley and look forward to participating in their care.  Signed: Elena Davia A 02/01/2015, 8:59 AM   I spent a total of  20 Minutes   in face to face in clinical consultation, greater than 50% of which was counseling/coordinating care for cerebral arteriogram

## 2015-02-01 NOTE — Discharge Instructions (Signed)

## 2015-02-01 NOTE — Sedation Documentation (Signed)
Exoseal in place - bedrest 3 hrs. Pulses 2t

## 2015-02-02 ENCOUNTER — Telehealth: Payer: Self-pay | Admitting: Neurology

## 2015-02-02 NOTE — Telephone Encounter (Signed)
Patient called wanting to speak with DR. Xu in regards to his work restrictions and when he can go back to work. Please call and advise. Patient can be reached @ [phone]

## 2015-02-02 NOTE — Telephone Encounter (Signed)
I know this patient's wife Misty Stanley who works with me in the hospital. I have spoken to her multiple times and in fact patient even had a catheter angiogram yesterday which was fine. So we do not need to separately inform the patient or his wife.

## 2015-02-02 NOTE — Telephone Encounter (Signed)
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 He is requesting to return to work next Monday and needs to know his restrictions. 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 He is also asking if he can resume sexual activity, Sporting [employer] shooting, exercise/lifting. Informed patient Dr Roda Shutters is out of country until July; Dr Pearlean Brownie is covering hospital, and this RN will route his questions to Dr Pearlean Brownie.  He verbalized understanding, appreciation.

## 2015-02-03 ENCOUNTER — Ambulatory Visit: Payer: BLUE CROSS/BLUE SHIELD | Admitting: Occupational Therapy

## 2015-02-03 NOTE — Telephone Encounter (Signed)
I spoke to him and answered questions. Advised to return to work next week and increase activity gradually.

## 2015-02-03 NOTE — Telephone Encounter (Signed)
I need a home or mobile number to call him. The number listed is work number and he is out of work at present hence cannot leave a message

## 2015-02-07 ENCOUNTER — Ambulatory Visit: Payer: BLUE CROSS/BLUE SHIELD | Admitting: Occupational Therapy

## 2015-02-15 ENCOUNTER — Encounter: Payer: BLUE CROSS/BLUE SHIELD | Admitting: Occupational Therapy

## 2015-02-17 ENCOUNTER — Encounter: Payer: BLUE CROSS/BLUE SHIELD | Admitting: Occupational Therapy

## 2015-02-22 ENCOUNTER — Encounter: Payer: BLUE CROSS/BLUE SHIELD | Admitting: Occupational Therapy

## 2015-02-24 ENCOUNTER — Encounter: Payer: BLUE CROSS/BLUE SHIELD | Admitting: Occupational Therapy

## 2015-02-28 ENCOUNTER — Ambulatory Visit: Payer: BLUE CROSS/BLUE SHIELD | Attending: Neurology | Admitting: Occupational Therapy

## 2015-02-28 DIAGNOSIS — H539 Unspecified visual disturbance: Secondary | ICD-10-CM

## 2015-02-28 DIAGNOSIS — H532 Diplopia: Secondary | ICD-10-CM | POA: Diagnosis present

## 2015-02-28 NOTE — Therapy (Signed)
[facility] [address] [place], [place], [address] Phone: [phone]   Fax:  [phone]  Occupational Therapy Treatment  Patient Details  Name: Alan BonineBruce E Beigel MRN: [id number] Date of Birth: 07-13-1961 Referring Provider:  Kristie CowmanSchooler, Karen, MD  Encounter Date: 02/28/2015      OT End of Session - 02/28/15 0902    Visit Number 4   Number of Visits 8   Date for OT Re-Evaluation 02/17/15   OT Start Time 0851   OT Stop Time 0930   OT Time Calculation (min) 39 min   Activity Tolerance Patient tolerated treatment well   Behavior [facility] assessed/performed      Past Medical History  Diagnosis Date  . Personal history of other diseases of digestive system   . Personal history of urinary calculi     Past Surgical History  Procedure Laterality Date  . Lithotripsy    . Mouth surgery      There were no vitals filed for this visit.  Visit Diagnosis:  Visual disturbance  Diplopia      Subjective Assessment - 02/28/15 0900    Subjective  "It's getting better.  I have more range and I am having to close the L eye less at work.  I only patch for exercises and I only tape for driving."   Pertinent History see epic snapshot   Patient Stated Goals To be able to see normally again   Currently in Pain? No/denies                      OT Treatments/Exercises (OP) - 02/28/15 0001    Visual/Perceptual Exercises   Copy this Image Pegboard   Pegboard positioned with design at point where pt had to incr focus to merge images on L side with good speed and accuracy (used L hand with pegs on L side to encourage L visual scanning)   Word Finding placed paper in position to encourage incr focus to merge images on L side of page.  Pt completed with incr time and min difficlty.  Encouraged/instructed pt to use organized scanning pattern L>R to incr L eye ROM/and coordination of eyes   Scanning  Environmental;Tabletop   Scanning - Environmental Pt positioned facing straight ahead with medium ball tossed to him from L side to encourage visual scanning to the L.  Pt will good response time with reacting to catch ball on the L.  Then hitting balloon tossed to pt in various location while in sitting to encourage visual scanning with slow moving target.   Other Exercises Briefly                 OT Education - 02/28/15 1321    Education Details added to HEP   Person(s) Educated Patient   Methods Explanation;Demonstration;Verbal cues;Handout   Comprehension Verbalized understanding;Returned demonstration             OT Long Term Goals - 02/28/15 0903    OT LONG TERM GOAL #1   Title Pt will be mod I with HEP - 02/17/2015   Status Achieved   OT LONG TERM GOAL #2   Title Pt will verbalize understanding of compensatory strategies for reading and using computer   Status Achieved   OT LONG TERM GOAL #3   Title Pt will verbalize understanding of difference between compensation vs remedial techniques.    Status Achieved   OT LONG TERM GOAL #4  Title Pt will functionally be able to use computer or read for at least 20 minutes without eye fatigue or headache using strategies.               Plan - 02/28/15 0948    Clinical Impression Statement Pt is progressing with increased L eye ROM and increased ability to merge images in primary gaze at further distance.  Exercises going well.   Plan continue with compensation and visual scanning    OT Home Exercise Plan Issued:  Added to HEP 02/28/15   Consulted and Agree with Plan of Care Patient        Problem List Patient Active Problem List   Diagnosis Date Noted  . AVM (arteriovenous malformation)   . Hemorrhage   . Hyperlipidemia 12/30/2014  . Obesity 12/30/2014  . ICH (intracerebral hemorrhage) 12/27/2014  . 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

## 2015-02-28 NOTE — Patient Instructions (Signed)
Additional vision Exercises:

## 2015-03-03 ENCOUNTER — Encounter: Payer: Self-pay | Admitting: Occupational Therapy

## 2015-03-03 ENCOUNTER — Ambulatory Visit: Payer: BLUE CROSS/BLUE SHIELD | Admitting: Occupational Therapy

## 2015-03-03 ENCOUNTER — Ambulatory Visit: Payer: Self-pay | Admitting: Neurology

## 2015-03-03 DIAGNOSIS — H532 Diplopia: Secondary | ICD-10-CM

## 2015-03-03 DIAGNOSIS — H539 Unspecified visual disturbance: Secondary | ICD-10-CM | POA: Diagnosis not present

## 2015-03-03 NOTE — Therapy (Signed)
[facility] [address] [place], [place], [address] Phone: [phone]   Fax:  [phone]  Occupational Therapy Treatment  Patient Details  Name: Alan Dudley MRN: [id number] Date of Birth: 1961-08-05 Referring Provider:  Kristie Cowman, MD  Encounter Date: 03/03/2015      OT End of Session - 03/03/15 0933    Visit Number 5   Number of Visits 8   Date for OT Re-Evaluation 02/17/15   OT Start Time 0845   OT Stop Time 0915   OT Time Calculation (min) 30 min   Activity Tolerance Patient tolerated treatment well   Behavior During Therapy [facility] for tasks assessed/performed      Past Medical History  Diagnosis Date  . Personal history of other diseases of digestive system   . Personal history of urinary calculi     Past Surgical History  Procedure Laterality Date  . Lithotripsy    . Mouth surgery      There were no vitals filed for this visit.  Visit Diagnosis:  Visual disturbance  Diplopia      Subjective Assessment - 03/03/15 0924    Subjective  The amount of change I have seen in the last two weeks has been incredible.  I see improvement every day.   Pertinent History see epic snapshot   Patient Stated Goals To be able to see normally again                      OT Treatments/Exercises (OP) - 03/03/15 0001    Visual/Perceptual Exercises   Other Exercises Reviewed home exercise program and activity program.  Patient very rarely using compensatory strategies at this point, and only when safety is a factor, i.e. target shooting, and driving.  Patient reports improved tolerance with reading and computer use, and is challenging himself by shifting image slightly to left to address saccadic movement in a more challenging plane.  Discussed challenging HEP by adding distance (longer than his own arm for tracking exercises, also discussed when ready attempting driving in quiet parking area  working on adjusting sight from close to far vision.  Patient not quite ready to attempt this at this point                OT Education - 03/03/15 0932    Education provided Yes   Education Details upgraded HEP to add distance to tracking targets   Person(s) Educated Patient   Methods Explanation;Demonstration;Verbal cues   Comprehension Verbalized understanding;Returned demonstration             OT Long Term Goals - 02/28/15 0903    OT LONG TERM GOAL #1   Title Pt will be mod I with HEP - 02/17/2015   Status Achieved   OT LONG TERM GOAL #2   Title Pt will verbalize understanding of compensatory strategies for reading and using computer   Status Achieved   OT LONG TERM GOAL #3   Title Pt will verbalize understanding of difference between compensation vs remedial techniques.    Status Achieved   OT LONG TERM GOAL #4   Title Pt will functionally be able to use computer or read for at least 20 minutes without eye fatigue or headache using strategies.               Plan - 03/03/15 0933    Clinical Impression Statement Patient has shown tremendous improvement with ocualr movement, and coordination, knowledge of remediation  and compensation.     Pt will benefit from skilled therapeutic intervention in order to improve on the following deficits (Retired) Impaired vision/preception   Rehab Potential Good   Clinical Impairments Affecting Rehab Potential Pt will benefit from skilled OT to address deficits outlined in eval.   OT Frequency 2x / week   OT Duration 4 weeks   OT Treatment/Interventions Self-care/ADL training;Therapeutic exercise;Therapeutic activities;Patient/family education;Visual/perceptual remediation/compensation   Plan Patient is very close to meeting all long term goals, and discussed possibility of discharging next visit.   Consulted and Agree with Plan of Care Patient        Problem List Patient Active Problem List   Diagnosis Date Noted  .  AVM (arteriovenous malformation)   . Hemorrhage   . Hyperlipidemia 12/30/2014  . Obesity 12/30/2014  . ICH (intracerebral hemorrhage) 12/27/2014  . 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

## 2015-03-07 ENCOUNTER — Encounter: Payer: BLUE CROSS/BLUE SHIELD | Admitting: Occupational Therapy

## 2015-03-08 ENCOUNTER — Ambulatory Visit: Payer: Self-pay | Admitting: Neurology

## 2015-03-08 ENCOUNTER — Ambulatory Visit: Payer: BLUE CROSS/BLUE SHIELD | Admitting: Occupational Therapy

## 2015-03-08 ENCOUNTER — Encounter: Payer: Self-pay | Admitting: Occupational Therapy

## 2015-03-08 DIAGNOSIS — H532 Diplopia: Secondary | ICD-10-CM

## 2015-03-08 DIAGNOSIS — H539 Unspecified visual disturbance: Secondary | ICD-10-CM | POA: Diagnosis not present

## 2015-03-08 NOTE — Therapy (Signed)
[facility] [address] [place], [place], [address] Phone: [phone]   Fax:  [phone]  Occupational Therapy Treatment  Patient Details  Name: Alan Dudley MRN: [id number] Date of Birth: 13-Jan-1961 Referring Provider:  Dorian Heckle, MD  Encounter Date: 03/08/2015      OT End of Session - 03/08/15 1655    Visit Number 6   Number of Visits 8   Date for OT Re-Evaluation 03/08/15   OT Start Time 33   OT Stop Time 1550   OT Time Calculation (min) 20 min   Activity Tolerance Patient tolerated treatment well      Past Medical History  Diagnosis Date  . Personal history of other diseases of digestive system   . Personal history of urinary calculi     Past Surgical History  Procedure Laterality Date  . Lithotripsy    . Mouth surgery      There were no vitals filed for this visit.  Visit Diagnosis:  Visual disturbance  Diplopia      Subjective Assessment - 03/08/15 1532    Subjective  I can see in so many planes now without double vision.   Pertinent History see epic snapshot   Patient Stated Goals To be able to see normally again   Currently in Pain? No/denies                      OT Treatments/Exercises (OP) - 03/08/15 0001    Exercises   Exercises Visual/Perceptual   Visual/Perceptual Exercises   Other Exercises Pt with significant improvement in ocular motor control and range of motion especially for abduction. Pt with no doube vision at all with close range, with no double vision in far range. Pt remains with double vision in mid range in left fields only (no longer present in midrange). Pt able to correct double vision into left field with slow movements and increased time. Pt is only using occulsion at this time for driving.  Pt is able to modify environment as needed to use computer for functional times for work. Discussed how to upgrade HEP - increase by speed and range, as  well as addressing quick saccadic movements and varying distance of objects he is tracking. Pt at this point feels he has a good understanding of progression and feels he can move forward without additional OT visits at this time. Pt will request OT order  in the future if he feels he needs further services.                      OT Long Term Goals - 03/08/15 1653    OT LONG TERM GOAL #1   Title Pt will be mod I with HEP - 02/17/2015   Status Achieved   OT LONG TERM GOAL #2   Title Pt will verbalize understanding of compensatory strategies for reading and using computer   Status Achieved   OT LONG TERM GOAL #3   Title Pt will verbalize understanding of difference between compensation vs remedial techniques.    Status Achieved   OT LONG TERM GOAL #4   Title Pt will functionally be able to use computer or read for at least 20 minutes without eye fatigue or headache using strategies.   Status Achieved               Plan - 03/08/15 1653    Clinical Impression Statement Pt has met all LTG's  and is knowledgeable about how to progress HEP as well as about adaptation, remediation and habituation. Pt in agreement to d/c from OT services at this time with the understanding that he will ask for a referral if he or MD feel he needs addtional services in the future.    Pt will benefit from skilled therapeutic intervention in order to improve on the following deficits (Retired) Impaired vision/preception   Rehab Potential Good   Clinical Impairments Affecting Rehab Potential Pt will benefit from skilled OT to address deficits outlined in eval.   OT Frequency 2x / week   OT Duration 4 weeks   OT Treatment/Interventions Self-care/ADL training;Therapeutic exercise;Therapeutic activities;Patient/family education;Visual/perceptual remediation/compensation   Plan d/c from OT at this time; pt to seek OT order at later date if he feels he needs additional services at that time.   OT Home  Exercise Plan Issued:  Added to HEP 03/08/15   Consulted and Agree with Plan of Care Patient        Problem List Patient Active Problem List   Diagnosis Date Noted  . AVM (arteriovenous malformation)   . Hemorrhage   . Hyperlipidemia 12/30/2014  . Obesity 12/30/2014  . ICH (intracerebral hemorrhage) 12/27/2014  . 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 [place], [place], [address] Phone: [phone]   Fax:  [phone]

## 2015-03-10 ENCOUNTER — Ambulatory Visit: Payer: BLUE CROSS/BLUE SHIELD | Admitting: Occupational Therapy

## 2015-03-29 ENCOUNTER — Ambulatory Visit: Payer: Self-pay | Admitting: Neurology

## 2015-04-11 ENCOUNTER — Ambulatory Visit (INDEPENDENT_AMBULATORY_CARE_PROVIDER_SITE_OTHER): Payer: BLUE CROSS/BLUE SHIELD | Admitting: Neurology

## 2015-04-11 ENCOUNTER — Encounter: Payer: Self-pay | Admitting: Neurology

## 2015-04-11 VITALS — BP 123/87 | HR 72 | Ht 68.0 in | Wt 212.2 lb

## 2015-04-11 DIAGNOSIS — H532 Diplopia: Secondary | ICD-10-CM

## 2015-04-11 DIAGNOSIS — I613 Nontraumatic intracerebral hemorrhage in brain stem: Secondary | ICD-10-CM | POA: Diagnosis not present

## 2015-04-11 DIAGNOSIS — E785 Hyperlipidemia, unspecified: Secondary | ICD-10-CM

## 2015-04-11 NOTE — Patient Instructions (Signed)
-   continue zetia and lipitor for HLD - Follow up with your primary care physician for stroke risk factor modification. Recommend maintain blood pressure goal <130/80, diabetes with hemoglobin A1c goal below 6.5% and lipids with LDL cholesterol goal below 70 mg/dL.  - continue eye exercise  - check lipid panel and liver function with your next annual check. - avoid stress at work  - check BP at home - follow up in 4 months.

## 2015-04-12 DIAGNOSIS — I613 Nontraumatic intracerebral hemorrhage in brain stem: Secondary | ICD-10-CM | POA: Insufficient documentation

## 2015-04-12 DIAGNOSIS — E782 Mixed hyperlipidemia: Secondary | ICD-10-CM | POA: Insufficient documentation

## 2015-04-12 DIAGNOSIS — H532 Diplopia: Secondary | ICD-10-CM | POA: Insufficient documentation

## 2015-04-12 NOTE — Progress Notes (Signed)
STROKE NEUROLOGY FOLLOW UP NOTE  NAME: Alan Dudley DOB: 09/06/1960  REASON FOR VISIT: stroke follow up HISTORY FROM: pt and chart  Today we had the pleasure of seeing Alan Dudley in follow-up at our Neurology Clinic. Pt was accompanied by no one.   History Summary Alan Dudley is a 54 y.o. 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 From the mild symptoms and the size of hemorrhage, it was speculated that the hemorrhage was likely venous bleeding, could be from cavernoma or DVA. He was treated with supportive care including blood pressure control and PT/OT. His BP controlled well without BP meds. LDL and TG was high and put on statin. He continued to have diplopia on discharge on 12/30/14. However, he was readmitted on 12/31/14 for right arm numbness, repeat CT no significant change. The symptoms were likely due to the edema surrounding the pontine lesion. He was then discharged on 01/02/15.  Interval History During the interval time, the patient has been doing well.  He followed up with OT for diplopia, and for the last one month, his diplopia had significant improvement. Currently on left gaze diplopia only occurs more laterally. His BP stable at home, today 123/87. He has back to work full time. He had MRI amd MRA repeat on 01/24/15 showed resolution of bleeding, likely DVA or cavernoma. However, also raised concern of possible feeder artery for AVM. Therefore, he had cerebral angio on 02/01/15 which ruled out AVM or other vessel abnormality. He is doing well with only complain of mildly weak erection during sexual intercourse. He has appointment with PCP next month.   REVIEW OF SYSTEMS: Full 14 system review of systems performed and notable only for those listed below and in HPI above, all others are negative:  Constitutional:     Cardiovascular:  Ear/Nose/Throat:   Skin:  Eyes:  Double vision Respiratory:   Gastroitestinal:   Genitourinary:  Hematology/Lymphatic:   Endocrine:  Musculoskeletal:   Allergy/Immunology:   Neurological:   Psychiatric:  Sleep:   The following represents the patient's updated allergies and side effects list: Allergies  Allergen Reactions  . Bacitracin Other (See Comments)    childhood    The neurologically relevant items on the patient's problem list were reviewed on today's visit.  Neurologic Examination  A problem focused neurological exam (12 or more points of the single system neurologic examination, vital signs counts as 1 point, cranial nerves count for 8 points) was performed.  Blood pressure 123/87, pulse 72, height  (1.727 m), weight 212 lb 3.2 oz (96.253 kg).  General - Well nourished, well developed, in no apparent distress.  Ophthalmologic - Sharp disc margins OU.  Cardiovascular - Regular rate and rhythm with no murmur.  Mental Status -  Level of arousal and orientation to time, place, and person were intact. Language including expression, naming, repetition, comprehension was assessed and found intact. Fund of Knowledge was assessed and was intact.  Cranial Nerves II - XII - II - Visual field intact OU. III, IV, VI - Extraocular movements showed mild left CN VI palsy with diplopia on left gaze more laterally. V - Facial sensation intact bilaterally. VII - Facial movement intact bilaterally. VIII - Hearing & vestibular intact bilaterally. X - Palate elevates symmetrically. XI - Chin turning & shoulder shrug intact bilaterally. XII - Tongue protrusion intact.  Motor Strength -  The patient's strength was normal in all extremities and pronator drift was absent.  Bulk was normal and fasciculations were absent.   Motor Tone - Muscle tone was assessed at the neck and appendages and was normal.  Reflexes - The patient's reflexes were 1+ in all  extremities and he had no pathological reflexes.  Sensory - Light touch, temperature/pinprick were assessed and were normal.    Coordination - The patient had normal movements in the hands and feet with no ataxia or dysmetria.  Tremor was absent.  Gait and Station - The patient's transfers, posture, gait, station, and turns were observed as normal.  Data reviewed: I personally reviewed the images and agree with the radiology interpretations.  Ct Head Wo Contrast 01/02/15 Evolving inferior pontine intraparenchymal hematoma, relatively unchanged. No obstructive hydrocephalus or new hemorrhage. 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  12/31/2014  1. No interval change in subacute left pontine medullary hemorrhage.  2 No new hemorrhage or cortical infarction identified.   12/30/2014  1. 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  2. No new intracranial abnormality.  12/28/2014  1. Stable left pontomedullary hemorrhage measuring 11 x 7.5 mm on axial imaging. 2. Chiari 1 malformation. 3. No significant interval change.  Ct Angio Head and Neck W/cm &/or Wo Cm 12/27/2014 1 cm hemorrhage or hemorrhagic infarction at the left pontomedullary junction. No subarachnoid blood. No abnormal vascular structures seen in that region. CT angiography of the large and medium size vessels is normal.   Mr Alan Dudley Wo Contrast  12/27/2014 IMPRESSION: 9 x 9 x 12 mm acute hemorrhage LEFT paramedian medulla. Marked susceptibility. Faint postcontrast enhancement. Favor brainstem hemorrhage related to an occult cerebral vascular malformation; see discussion above. Significant cerebellar tonsillar descent measuring 10-11 mm. 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 Findings consistent with Chiari I malformation although some of the cerebellar descent could relate to  mass effect from the medullary hemorrhage.  01/20/15 This is an abnormal MRI of the brain with and without contrast showing abnormal signal in the upper medulla and lower pons consistent with a developmental venous anomaly or cavernous malformation.. Since the prior MRI dated 12/27/2014, there has been signal changes consistent with the presence of subacute intracellular methemoglobin and evolution of hemorrhage associated with the AVM. 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   MRA of head 01/20/15 This is an abnormal MR angiogram a 9 x 10 mm focus in the upper medulla and 95 mm focus in the lower pons. The medullary focus appears to have a possible feeder artery inferomedially. These likely represent a developmental venous anomaly or a cavernoma complex. Conventional angiography might better reveal the origin of this anomoly. Incidental note is also made of decreased flow in the distal right ACA.  Cerebral angio 02/01/15 Angiographically no occlusions, stenoses, dissections or arteriovenous shunting to suggest dural AV fistula or arteriovenous malformation is noted No evidence of intracanal aneurysm. Venous outflow within normal limits.  2D Echocardiogram - LVEF 65-70%, moderate concentric LVH, normal wall motion, diastolic dysfunction, indeterminate LV filling pressure.  Component     Latest Ref Rng 12/28/2014  Cholesterol     0 - 200 mg/dL 409 (H)  Triglycerides     <150 mg/dL 811 (H)  HDL Cholesterol     >40 mg/dL 33 (L)  Total CHOL/HDL Ratio      7.0  VLDL     0 - 40 mg/dL UNABLE TO CALCULATE IF TRIGLYCERIDE OVER 400 mg/dL  LDL (calc)     0 - 99 mg/dL UNABLE TO CALCULATE IF TRIGLYCERIDE OVER 400 mg/dL  Hemoglobin Z6X     4.8 - 5.6 % 5.7 (H)  Mean Plasma Glucose      117  TSH     0.350 - 4.500 uIU/mL 3.021  Vitamin B-12     180 - 914 pg/mL 267  Free T4     0.61 - 1.12 ng/dL 0.96    Component     Latest Ref Rng 12/29/2014  Cholesterol     0 - 200 mg/dL 045 (H)  Triglycerides     <150 mg/dL 409 (H)  HDL Cholesterol     >40 mg/dL 31 (L)  Total CHOL/HDL Ratio      7.3  VLDL     0 - 40 mg/dL UNABLE TO CALCULATE IF TRIGLYCERIDE OVER 400 mg/dL  LDL (calc)     0 - 99 mg/dL UNABLE TO CALCULATE IF TRIGLYCERIDE OVER 400 mg/dL  Hemoglobin W1X     4.8 - 5.6 %   Mean Plasma Glucose        TSH     0.350 - 4.500 uIU/mL   Vitamin B-12     180 - 914 pg/mL   Free T4     0.61 - 1.12 ng/dL    Component     Latest Ref Rng 12/30/2014  Cholesterol     0 - 200 mg/dL 914 (H)  Triglycerides     <150 mg/dL 782 (H)  HDL Cholesterol     >40 mg/dL 32 (L)  Total CHOL/HDL Ratio      6.5  VLDL     0 - 40 mg/dL UNABLE TO CALCULATE IF TRIGLYCERIDE OVER 400 mg/dL  LDL (calc)     0 - 99 mg/dL UNABLE TO CALCULATE IF TRIGLYCERIDE OVER 400 mg/dL  Hemoglobin N5A     4.8 - 5.6 %   Mean Plasma Glucose        TSH     0.350 - 4.500 uIU/mL   Vitamin B-12     180 - 914 pg/mL   Free T4     0.61 - 1.12 ng/dL     Assessment: As you may recall, he is a 54 y.o. Caucasian male with PMH of tension HA and HLD admitted on 12/31/14 for left pontomedullary intra-axial hemorrhage. From the mild symptoms and the size of hemorrhage, it was speculated that the hemorrhage was likely venous bleeding, could be from cavernoma or DVA. He was treated with supportive care. LDL and TG was high and put on statin. Discharged on 12/30/14 but was readmitted on 12/31/14 for right arm numbness, repeat CT no significant change. The symptoms were likely due to the edema surrounding the pontine lesion. He was then discharged on 01/02/15. Repeat MRI and MRA showed resolution of bleeding but raised concern of AVM which was then ruled out by cerebral angio. Currently his left CN VI palsy much better with only diplopia on far left gaze. BP in good control without BP meds. Still on lipitor and zetia for HLD. Back to work full  time. Completed OT.   Plan:  - continue zetia and lipitor for HLD. Follow up with PCP next month to check lipid panel and liver function. Adjust medication accordingly. - Follow up with your primary care physician for  stroke risk factor modification. Recommend maintain blood pressure goal <130/80, diabetes with hemoglobin A1c goal below 6.5% and lipids with LDL cholesterol goal below 70 mg/dL.  - continue eye exercise   - check BP at home - RTC in 4 months.  I spent more than 25 minutes of face to face time with the patient. Greater than 50% of time was spent in counseling and coordination of care.   No orders of the defined types were placed in this encounter.    No orders of the defined types were placed in this encounter.    Patient Instructions  - continue zetia and lipitor for HLD - Follow up with your primary care physician for stroke risk factor modification. Recommend maintain blood pressure goal <130/80, diabetes with hemoglobin A1c goal below 6.5% and lipids with LDL cholesterol goal below 70 mg/dL.  - continue eye exercise  - check lipid panel and liver function with your next annual check. - avoid stress at work  - check BP at home - follow up in 4 months.   Marvel Plan, MD PhD [facility] [address], [address] [place], [place] [address] [phone]

## 2015-08-29 ENCOUNTER — Ambulatory Visit: Payer: BLUE CROSS/BLUE SHIELD | Admitting: Neurology

## 2015-09-02 ENCOUNTER — Encounter: Payer: Self-pay | Admitting: Neurology

## 2015-09-02 ENCOUNTER — Ambulatory Visit (INDEPENDENT_AMBULATORY_CARE_PROVIDER_SITE_OTHER): Payer: BLUE CROSS/BLUE SHIELD | Admitting: Neurology

## 2015-09-02 VITALS — BP 128/81 | HR 74 | Ht 68.0 in | Wt 217.4 lb

## 2015-09-02 DIAGNOSIS — E785 Hyperlipidemia, unspecified: Secondary | ICD-10-CM

## 2015-09-02 DIAGNOSIS — I613 Nontraumatic intracerebral hemorrhage in brain stem: Secondary | ICD-10-CM

## 2015-09-02 NOTE — Progress Notes (Signed)
STROKE NEUROLOGY FOLLOW UP NOTE  NAME: Alan Dudley DOB: February 12, 1961  REASON FOR VISIT: stroke follow up HISTORY FROM: pt and chart  Today we had the pleasure of seeing Alan Dudley in follow-up at our Neurology Clinic. Pt was accompanied by no one.   History Summary Alan Dudley is a 55 y.o. 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 From the mild symptoms and the size of hemorrhage, it was speculated that the hemorrhage was likely venous bleeding, could be from cavernoma or DVA. He was treated with supportive care including blood pressure control and PT/OT. His BP controlled well without BP meds. LDL and TG was high and put on statin. He continued to have diplopia on discharge on 12/30/14. However, he was readmitted on 12/31/14 for right arm numbness, repeat CT no significant change. The symptoms were likely due to the edema surrounding the pontine lesion. He was then discharged on 01/02/15.  04/11/15 follow up - the patient has been doing well.  He followed up with OT for diplopia, and for the last one month, his diplopia had significant improvement. Currently on left gaze diplopia only occurs more laterally. His BP stable at home, today 123/87. He has back to work full time. He had MRI amd MRA repeat on 01/24/15 showed resolution of bleeding, likely DVA or cavernoma. However, also raised concern of possible feeder artery for AVM. Therefore, he had cerebral angio on 02/01/15 which ruled out AVM or other vessel abnormality. He is doing well with only complain of mildly weak erection during sexual intercourse. He has appointment with PCP next month.   Interval History During the interval time, the pt has been doing well. Double vision resolved. Only sometimes if too tired, he may have some mild double vision and left bottom of foot numbness.  Complains of right eye redness, likely due to dry eyes. Recommended eye drops. BP 128/81. Still on lipitor and zetia, no side effects.   REVIEW OF SYSTEMS: Full 14 system review of systems performed and notable only for those listed below and in HPI above, all others are negative:  Constitutional:   Cardiovascular:  Ear/Nose/Throat:   Skin:  Eyes:  Eye redness Respiratory:   Gastroitestinal:   Genitourinary:  Hematology/Lymphatic:   Endocrine:  Musculoskeletal:   Allergy/Immunology:   Neurological:   Psychiatric:  Sleep:   The following represents the patient's updated allergies and side effects list: Allergies  Allergen Reactions  . Bacitracin Other (See Comments)    childhood    The neurologically relevant items on the patient's problem list were reviewed on today's visit.  Neurologic Examination  A problem focused neurological exam (12 or more points of the single system neurologic examination, vital signs counts as 1 point, cranial nerves count for 8 points) was performed.  Blood pressure 128/81, pulse 74, height 5\' 8"  (1.727 m), weight 217 lb 6.4 oz (98.612 kg).  General - Well nourished, well developed, in no apparent distress.  Ophthalmologic - Sharp disc margins OU.  Cardiovascular - Regular rate and rhythm with no murmur.  Mental Status -  Level of arousal and orientation to time, place, and person were intact. Language including expression, naming, repetition, comprehension was assessed and found intact. Fund of Knowledge was assessed and was intact.  Cranial Nerves II - XII - II - Visual field intact OU. III, IV, VI - Extraocular movements  intact. V - Facial sensation intact bilaterally. VII - Facial movement intact bilaterally. VIII - Hearing & vestibular intact bilaterally. X - Palate elevates symmetrically. XI - Chin turning & shoulder shrug intact bilaterally. XII - Tongue protrusion intact.  Motor Strength - The patient's strength was normal in  all extremities and pronator drift was absent.  Bulk was normal and fasciculations were absent.   Motor Tone - Muscle tone was assessed at the neck and appendages and was normal.  Reflexes - The patient's reflexes were 1+ in all extremities and he had no pathological reflexes.  Sensory - Light touch, temperature/pinprick were assessed and were normal.    Coordination - The patient had normal movements in the hands and feet with no ataxia or dysmetria.  Tremor was absent.  Gait and Station - The patient's transfers, posture, gait, station, and turns were observed as normal.  Data reviewed: I personally reviewed the images and agree with the radiology interpretations.  Ct Head Wo Contrast 01/02/15 Evolving inferior pontine intraparenchymal hematoma, relatively unchanged. No obstructive hydrocephalus or new hemorrhage. 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  12/31/2014  1. No interval change in subacute left pontine medullary hemorrhage.  2 No new hemorrhage or cortical infarction identified.   12/30/2014  1. 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  2. No new intracranial abnormality.  12/28/2014  1. Stable left pontomedullary hemorrhage measuring 11 x 7.5 mm on axial imaging. 2. Chiari 1 malformation. 3. No significant interval change.  Ct Angio Head and Neck W/cm &/or Wo Cm 12/27/2014 1 cm hemorrhage or hemorrhagic infarction at the left pontomedullary junction. No subarachnoid blood. No abnormal vascular structures seen in that region. CT angiography of the large and medium size vessels is normal.   Mr Laqueta JeanBrain W Wo Contrast  12/27/2014 IMPRESSION: 9 x 9 x 12 mm acute hemorrhage LEFT paramedian medulla. Marked susceptibility. Faint postcontrast enhancement. Favor brainstem hemorrhage related to an occult cerebral vascular malformation; see discussion above.  Significant cerebellar tonsillar descent measuring 10-11 mm. 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  01/20/15 This is an abnormal MRI of the brain with and without contrast showing abnormal signal in the upper medulla and lower pons consistent with a developmental venous anomaly or cavernous malformation.. Since the prior MRI dated 12/27/2014, there has been signal changes consistent with the presence of subacute intracellular methemoglobin and evolution of hemorrhage associated with the AVM. 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   MRA of head 01/20/15 This is an abnormal MR angiogram a 9 x 10 mm focus in the upper medulla and 95 mm focus in the lower pons. The medullary focus appears to have a possible feeder artery inferomedially. These likely represent a developmental venous anomaly or a cavernoma complex. Conventional angiography might better reveal the origin of this anomoly. Incidental note is also made of decreased flow in the distal right ACA.  Cerebral angio 02/01/15 Angiographically no occlusions, stenoses, dissections or arteriovenous shunting to suggest dural AV fistula or arteriovenous malformation is noted No evidence of intracanal aneurysm. Venous outflow within normal limits.  2D Echocardiogram - LVEF 65-70%, moderate concentric LVH, normal wall motion, diastolic dysfunction, indeterminate LV filling pressure.  Component     Latest Ref Rng 12/28/2014  Cholesterol     0 - 200 mg/dL 295 (H)  Triglycerides     <150 mg/dL 621 (H)  HDL Cholesterol     >40 mg/dL 33 (L)  Total CHOL/HDL Ratio      7.0  VLDL     0 - 40 mg/dL UNABLE TO CALCULATE IF TRIGLYCERIDE OVER 400 mg/dL  LDL (calc)     0 - 99 mg/dL UNABLE TO CALCULATE  IF TRIGLYCERIDE OVER 400 mg/dL  Hemoglobin H0Q     4.8 - 5.6 % 5.7 (H)  Mean Plasma Glucose      117  TSH     0.350 - 4.500 uIU/mL 3.021  Vitamin B-12     180 - 914 pg/mL 267  Free T4     0.61 - 1.12 ng/dL 6.57   Component     Latest Ref Rng 12/29/2014  Cholesterol     0 - 200 mg/dL 846 (H)  Triglycerides     <150 mg/dL 962 (H)  HDL Cholesterol     >40 mg/dL 31 (L)  Total CHOL/HDL Ratio      7.3  VLDL     0 - 40 mg/dL UNABLE TO CALCULATE IF TRIGLYCERIDE OVER 400 mg/dL  LDL (calc)     0 - 99 mg/dL UNABLE TO CALCULATE IF TRIGLYCERIDE OVER 400 mg/dL  Hemoglobin X5M     4.8 - 5.6 %   Mean Plasma Glucose        TSH     0.350 - 4.500 uIU/mL   Vitamin B-12     180 - 914 pg/mL   Free T4     0.61 - 1.12 ng/dL    Component     Latest Ref Rng 12/30/2014  Cholesterol     0 - 200 mg/dL 841 (H)  Triglycerides     <150 mg/dL 324 (H)  HDL Cholesterol     >40 mg/dL 32 (L)  Total CHOL/HDL Ratio      6.5  VLDL     0 - 40 mg/dL UNABLE TO CALCULATE IF TRIGLYCERIDE OVER 400 mg/dL  LDL (calc)     0 - 99 mg/dL UNABLE TO CALCULATE IF TRIGLYCERIDE OVER 400 mg/dL  Hemoglobin M0N     4.8 - 5.6 %   Mean Plasma Glucose        TSH     0.350 - 4.500 uIU/mL   Vitamin B-12     180 - 914 pg/mL   Free T4     0.61 - 1.12 ng/dL     Assessment: As you may recall, he is a 55 y.o. Caucasian male with PMH of tension HA and HLD admitted on 12/31/14 for left pontomedullary intra-axial hemorrhage. From the mild symptoms and the size of hemorrhage, it was speculated that the hemorrhage was likely venous bleeding, could be from cavernoma or DVA. He was treated with supportive care. LDL and TG was high and put on statin. Discharged on 12/30/14 but was readmitted on 12/31/14 for right arm numbness, repeat CT no significant change. The symptoms were likely due to the edema surrounding the pontine lesion. He was then discharged on 01/02/15. Repeat MRI and MRA showed resolution of bleeding but raised concern of  AVM which was then ruled out by cerebral angio. Currently his left CN VI palsy has resolved. BP in good control without BP meds. Still on lipitor and zetia for HLD. Back to work full time.  Plan:  - continue zetia and lipitor for HLD - Follow up with your primary care physician for stroke risk factor modification. Recommend maintain blood pressure goal <130/80, diabetes with hemoglobin A1c goal below 6.5% and lipids with LDL cholesterol goal below 70 mg/dL.  - check BP at home - follow up as needed.   No orders of the defined types were placed in this encounter.    No orders of the defined types were placed in this encounter.    Patient Instructions  - continue zetia and lipitor for HLD - Follow up with your primary care physician for stroke risk factor modification. Recommend maintain blood pressure goal <130/80, diabetes with hemoglobin A1c goal below 6.5% and lipids with LDL cholesterol goal below 70 mg/dL.  - check BP at home - follow up as needed.    Marvel Plan, MD PhD [facility] [address] [place], [place] [address] [phone]

## 2015-09-02 NOTE — Patient Instructions (Signed)
-   continue zetia and lipitor for HLD - Follow up with your primary care physician for stroke risk factor modification. Recommend maintain blood pressure goal <130/80, diabetes with hemoglobin A1c goal below 6.5% and lipids with LDL cholesterol goal below 70 mg/dL.  - check BP at home - follow up as needed.

## 2016-01-04 ENCOUNTER — Other Ambulatory Visit: Payer: Self-pay | Admitting: Neurology

## 2016-01-26 ENCOUNTER — Telehealth: Payer: Self-pay

## 2016-01-26 NOTE — Telephone Encounter (Signed)
Rn call [facility] at [phone]  [phone]. Rn spoke with pharmacist to state in last refill in May 2017 stating all refills should go to his PCP. Pt was to only follow up as needed. PT was prescribed two statins by Dr. Roda ShuttersXu. Pharmacist stated they will route the two meds to patients PCP.

## 2016-02-01 NOTE — Telephone Encounter (Signed)
Pt called back, he said [facility] called in RX for 1 mth supply. He will call and schedule appt.

## 2016-02-01 NOTE — Telephone Encounter (Signed)
[facility] called [facility]. He said he was seen last Aug 2016 for physical. He will call [facility] and check if refills were rec'd .

## 2016-04-12 IMAGING — CT CT HEAD W/O CM
2 series · 15 of 30 positions shown, 19 images · non-contrast
Comparison: CT of the head December 30, 2013 and MRI of the brain December 27, 2014

CLINICAL DATA: Diplopia, RIGHT leg weakness, intermittent
stroke-like symptoms for 2 days.

EXAM:
CT HEAD WITHOUT CONTRAST
TECHNIQUE: Contiguous axial images were obtained from the base of the skull
through the vertex without intravenous contrast.

[Series 201: head w/o, idose (1) · axial · non-contrast · 0.49mm/px · z∈[+103,+233]mm · 13 of 32 slices shown, 17 images]
[im 3/32  brain]
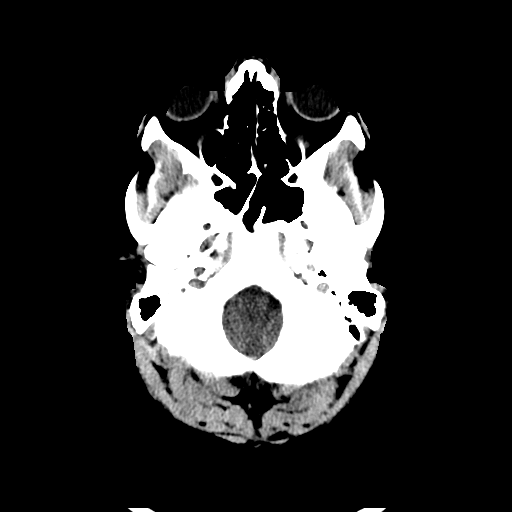
[im 3/32  bone]
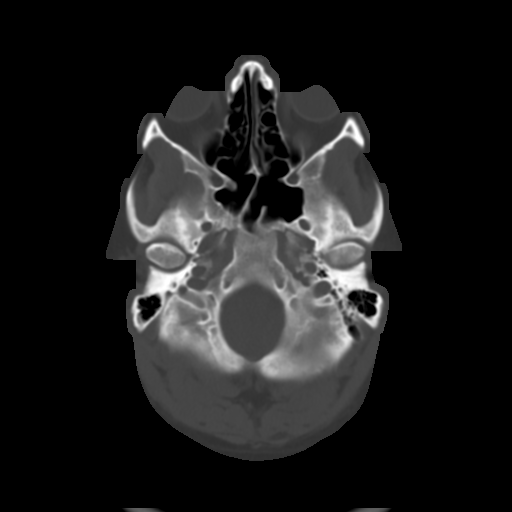
[im 5/32  brain]
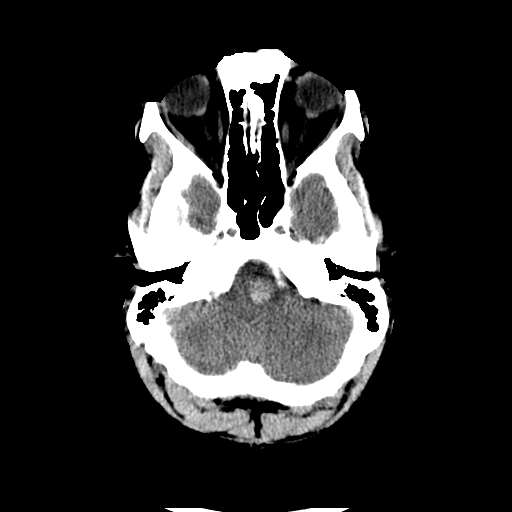
[im 7/32  brain]
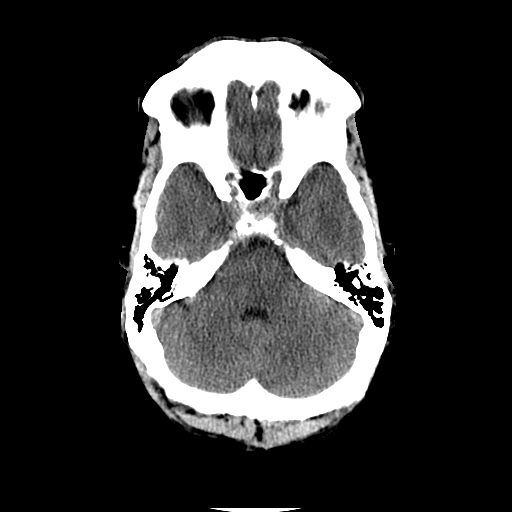
[im 9/32  brain]
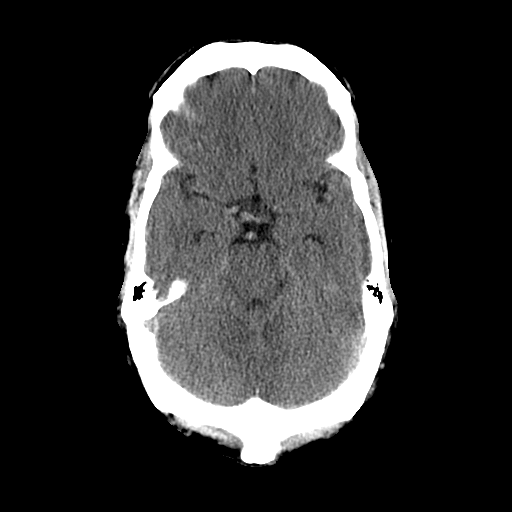
[im 12/32  brain]
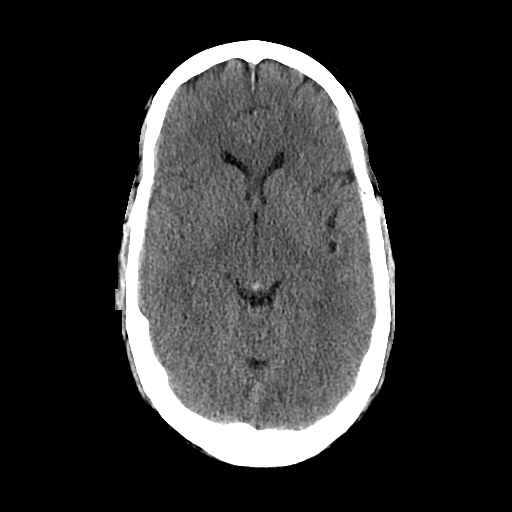
[im 12/32  bone]
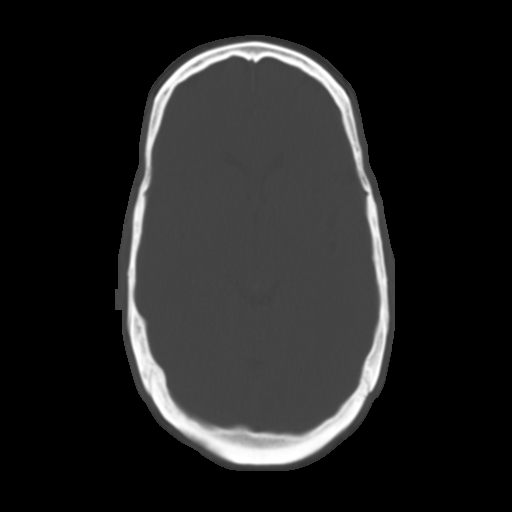
[im 14/32  brain]
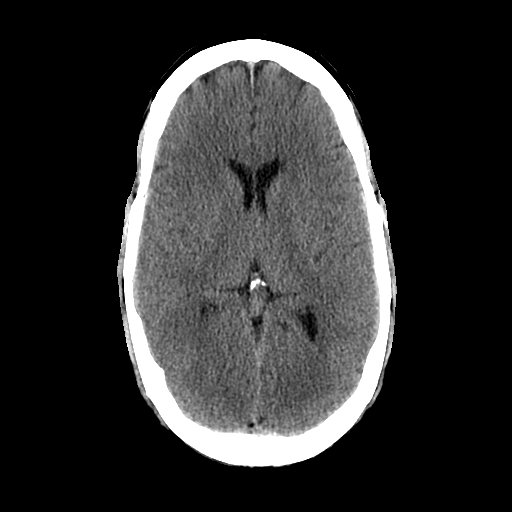
[im 16/32  brain]
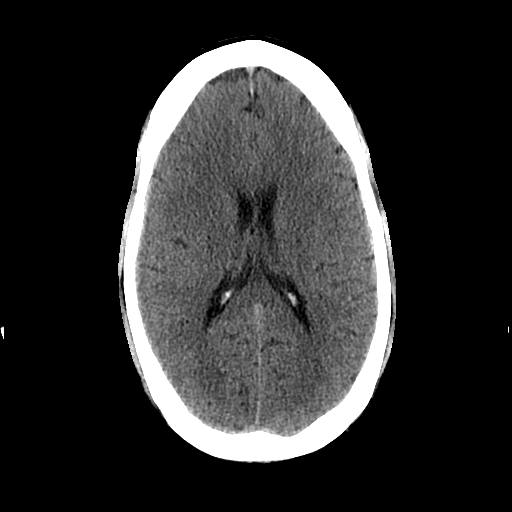
[im 18/32  brain]
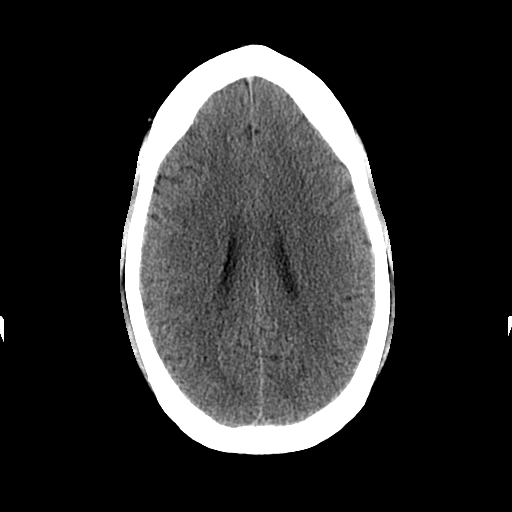
[im 20/32  brain]
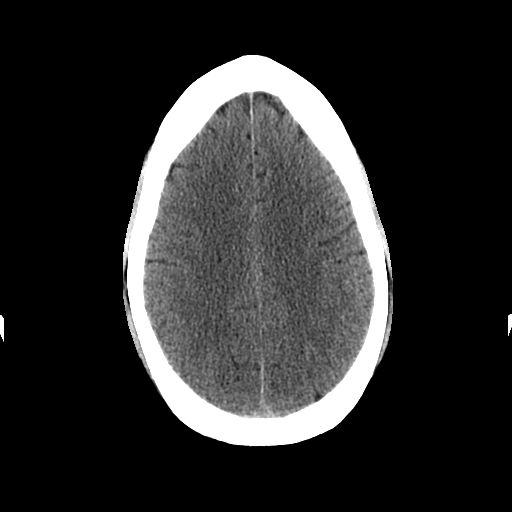
[im 20/32  bone]
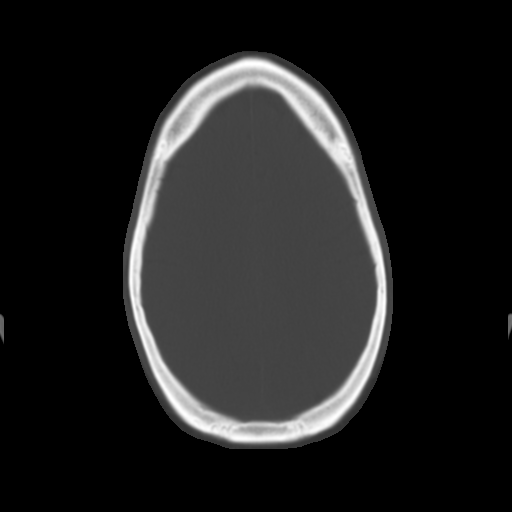
[im 23/32  brain]
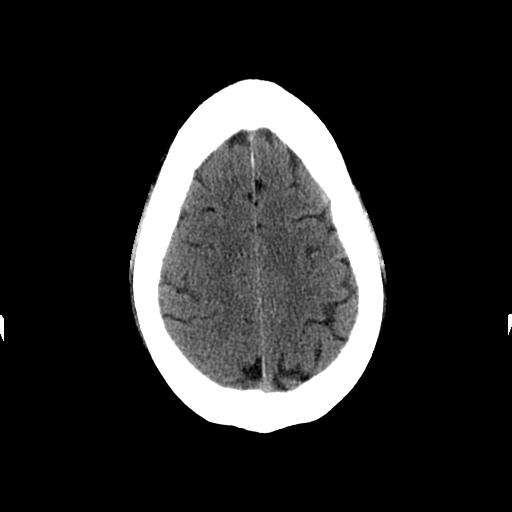
[im 25/32  brain]
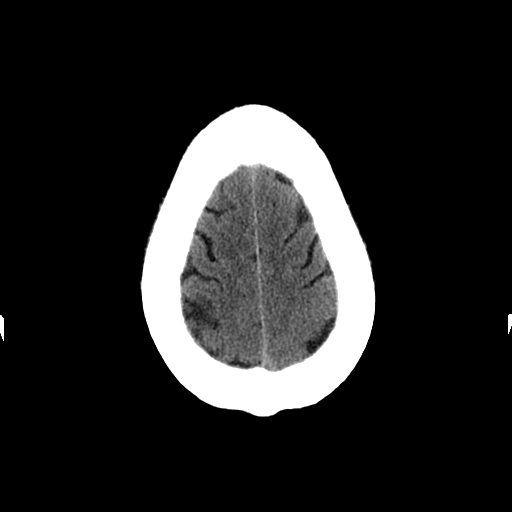
[im 27/32  brain]
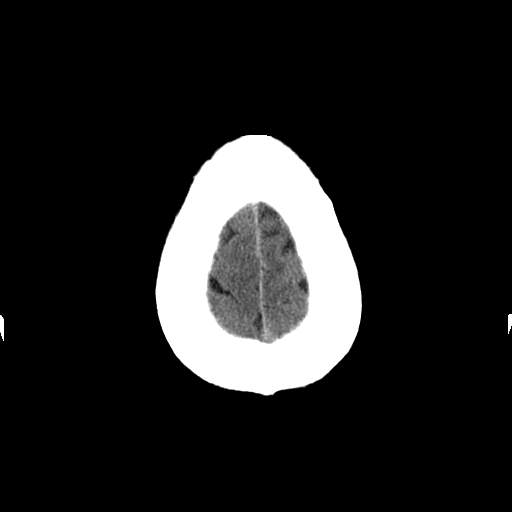
[im 29/32  brain]
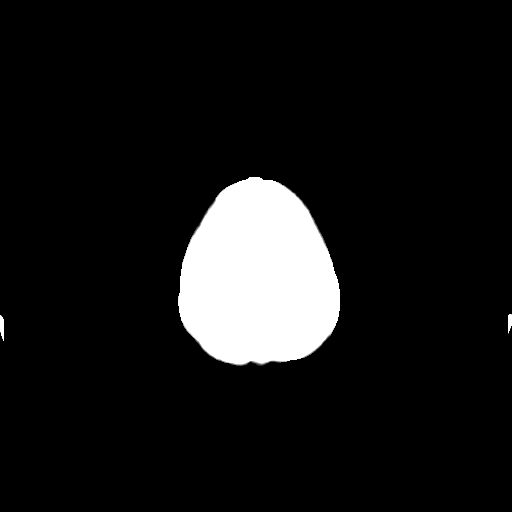
[im 29/32  bone]
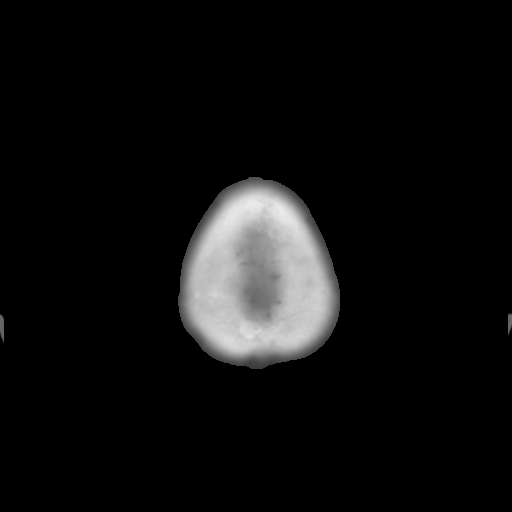

[Series 202: head w/o bone, idose (1) · axial · non-contrast · 0.49mm/px · z∈[+103,+123]mm · 2 of 32 slices shown]
[im 3/32  bone]
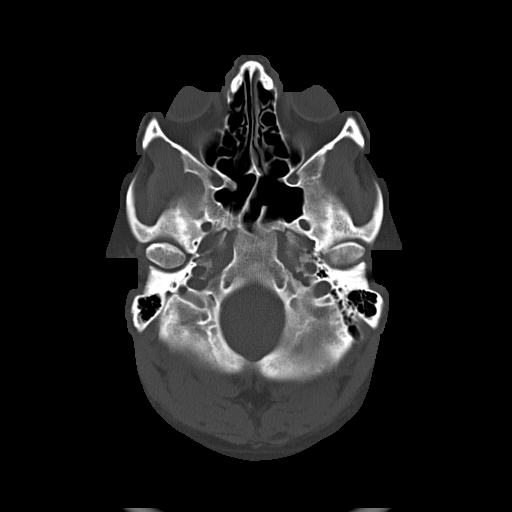
[im 7/32  bone]
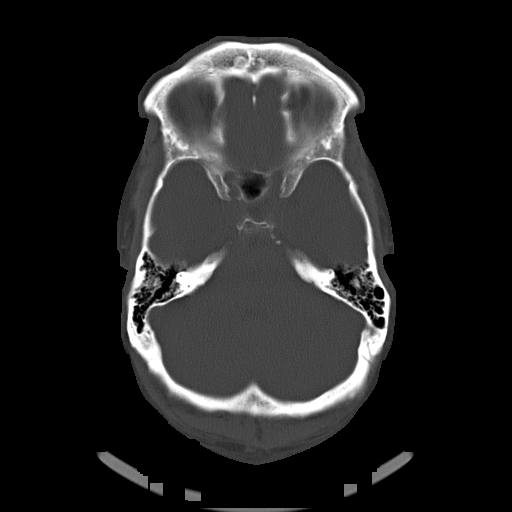

[15 of 30 positions shown; findings below may reference images not displayed]

FINDINGS: Inferior pontine 12 x 12 mm hematoma was 11 x 9 mm though, is
relatively unchanged. Mild effacement of cerebral spinal fluid space
at the foramen magnum corresponding to known cerebellar tonsillar
ectopia. No obstructive hydrocephalus. No supratentorial
intraparenchymal hemorrhage. No midline shift or acute large
vascular territory infarct. Basal cisterns are patent.

Ocular globes and orbital contents are unremarkable. Visualized
paranasal sinuses and mastoid air cells are well aerated. No skull
fracture.
IMPRESSION: Evolving inferior pontine intraparenchymal hematoma, relatively
unchanged. No obstructive hydrocephalus or new hemorrhage.

Findings of Chiari 1 malformation better seen on prior MRI.

By: Kim Fah Disa

## 2016-05-12 IMAGING — XA IR ANGIO INTRA EXTRACRAN SEL COM CAROTID INNOMINATE BILAT MOD SE
1 series · 13 of 24 positions shown · IV contrast (IODINE)
Comparison: none

CLINICAL DATA: Brainstem hemorrhagic stroke due to probable
cavernoma.

[Series 300: ir angio intra extracran sel com carotid · 13 of 203 slices shown]
[im 1/203]
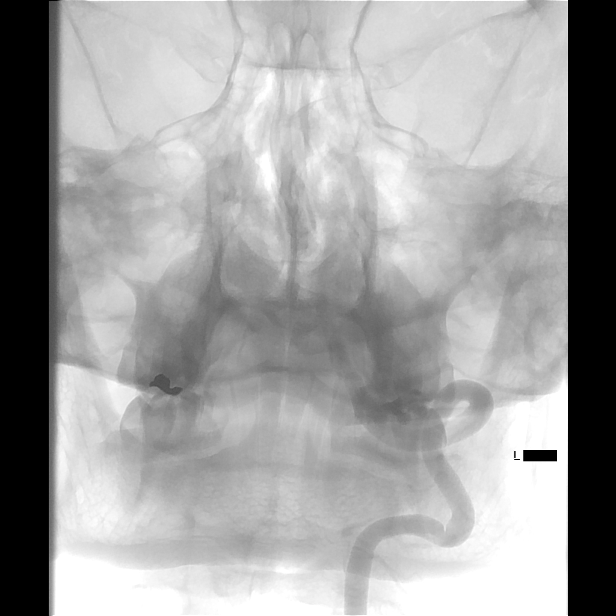
[im 18/203]
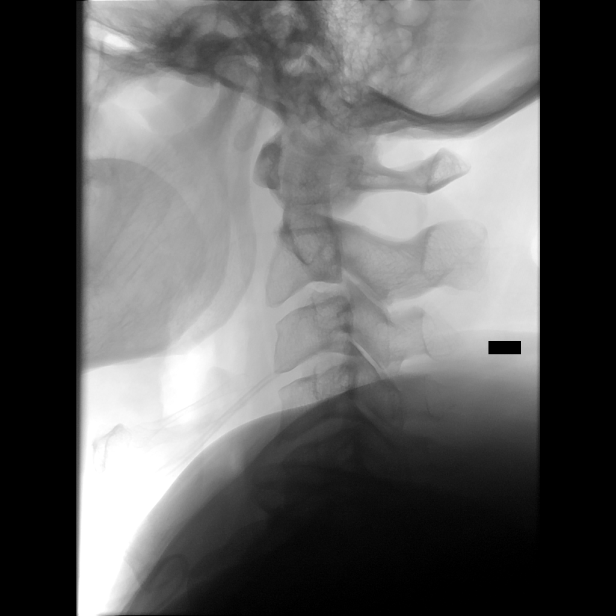
[im 36/203]
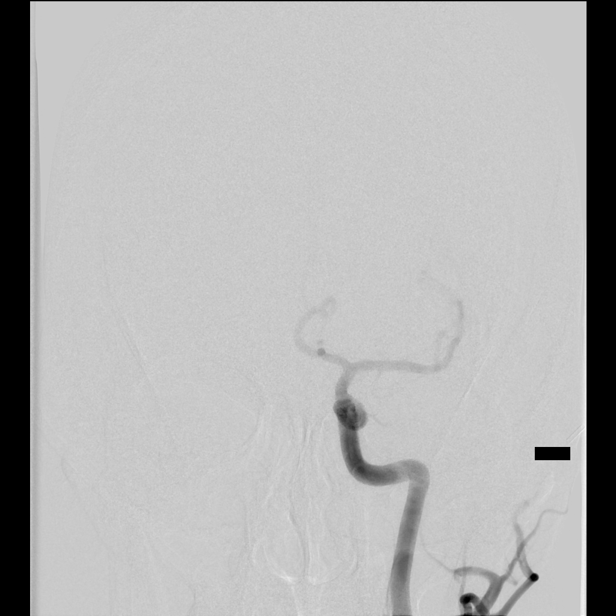
[im 53/203]
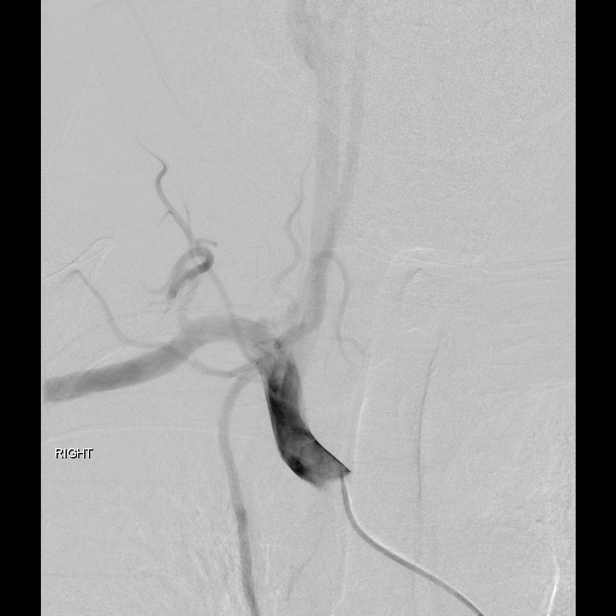
[im 71/203]
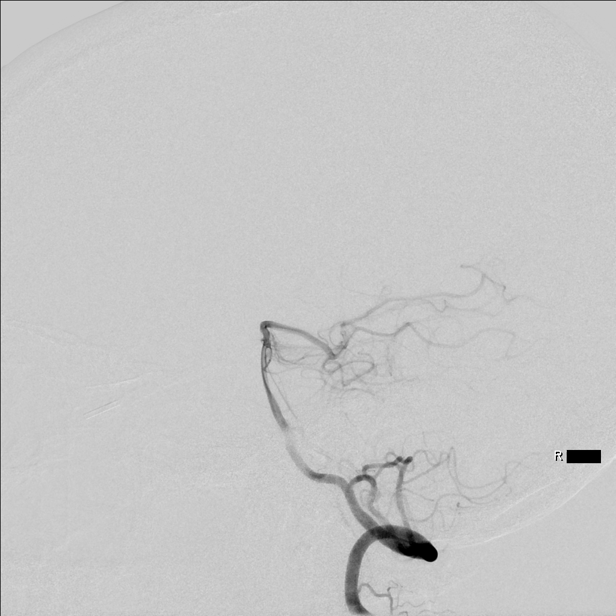
[im 88/203]
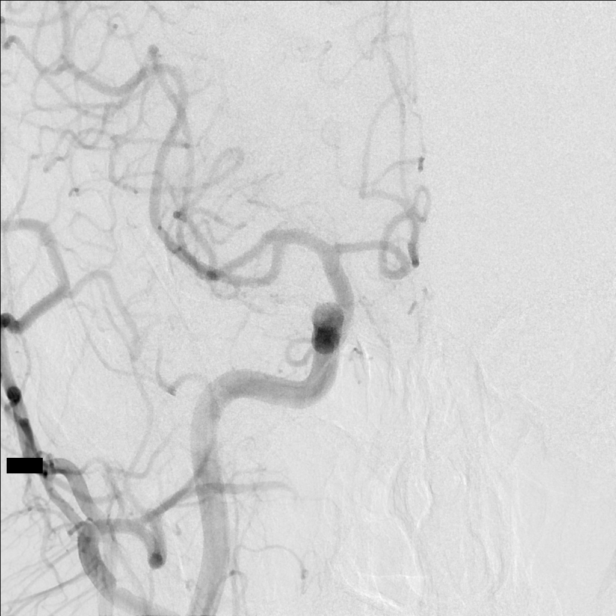
[im 106/203]
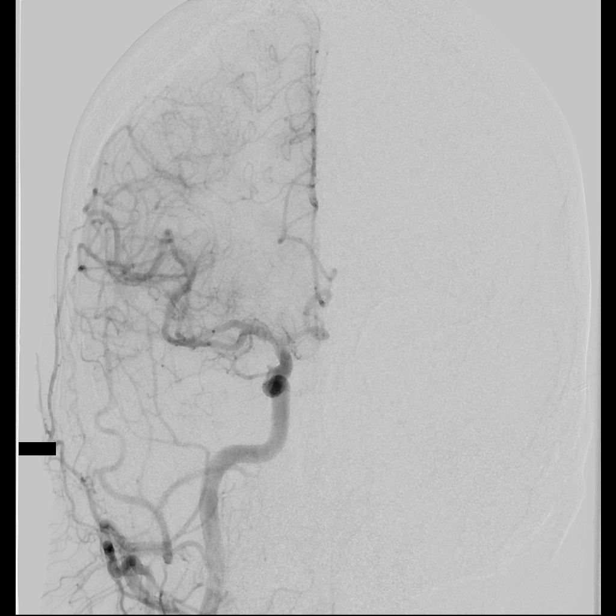
[im 115/203]
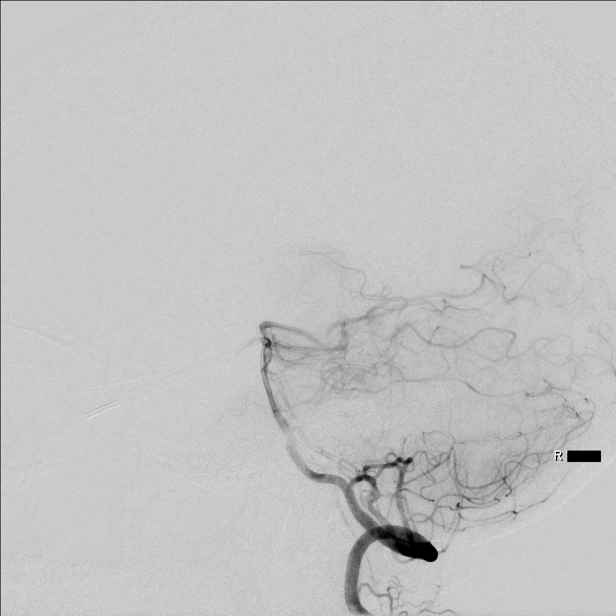
[im 132/203]
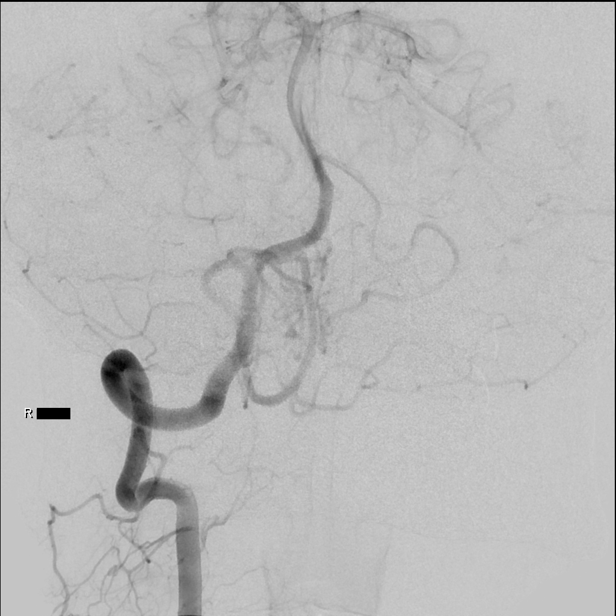
[im 150/203]
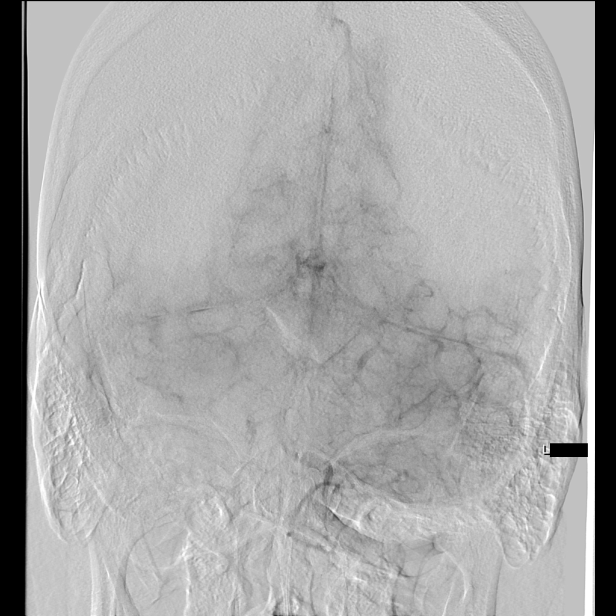
[im 167/203]
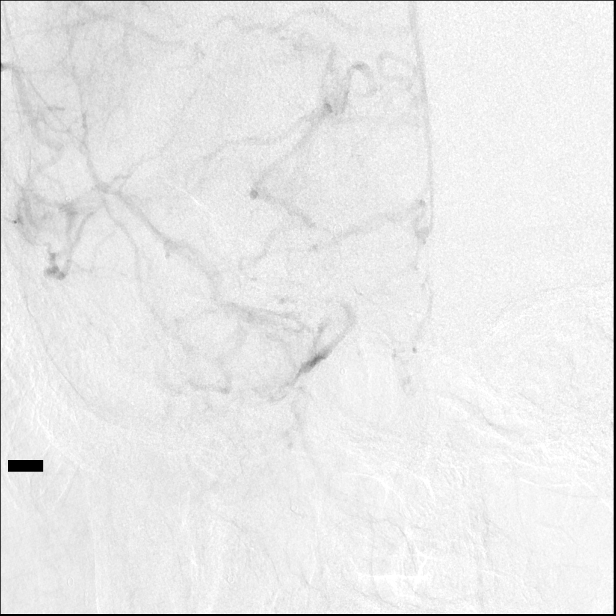
[im 185/203]
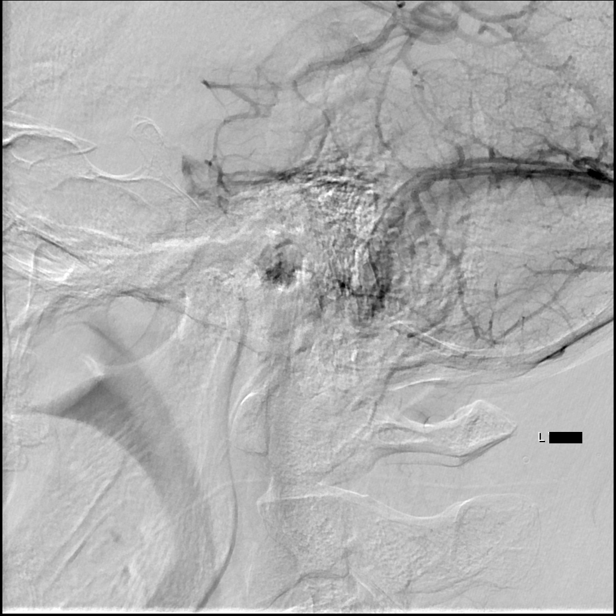
[im 203/203]
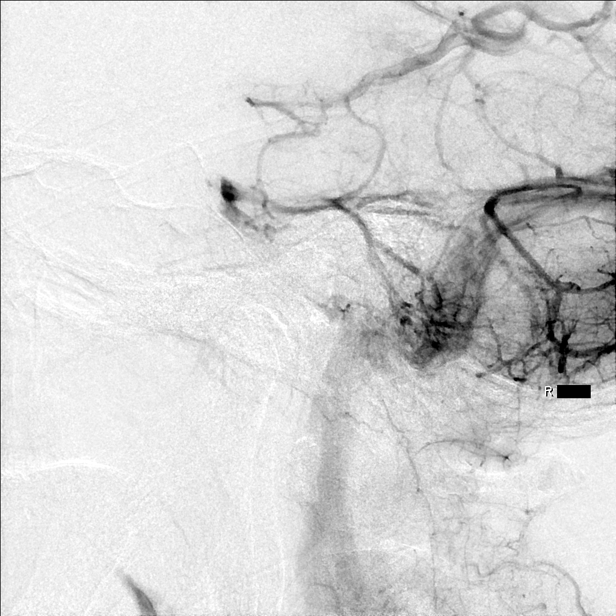

[13 of 24 positions shown; findings below may reference images not displayed]

EXAM:
BILATERAL COMMON CAROTID AND INNOMINATE ANGIOGRAPHY AND BILATERAL
VERTEBRAL ARTERY ANGIOGRAMS

PROCEDURE:
Contrast:   60ML OMNIPAQUE IOHEXOL 300 MG/ML  SOLN

Anesthesia/Sedation:  Conscious sedation.

Medications: Versed at 1 mg IV.  Fentanyl 25 mcg IV.

Following a full explanation of the procedure along with the
potential associated complications, an informed witnessed consent
was obtained.

The right groin was prepped and draped in the usual sterile fashion.
Thereafter using modified Seldinger technique, transfemoral access
into the right common femoral artery was obtained without
difficulty. Over a 0.035 inch guidewire, a 5 French Pinnacle sheath
was inserted. Through this, and also over 0.035 inch guidewire, a 5
French JB1 catheter was advanced to the aortic arch region and
selectively positioned in the right common carotid artery, the right
vertebral artery, the left common carotid artery and the left
vertebral artery.

There were no acute complications. The patient tolerated the
procedure well.
FINDINGS: The right common carotid arteriogram demonstrates the right external
carotid artery and its major branches to be normally opacified.

The right internal carotid artery at the bulb to the cranial skull
base opacifies normally.

The petrous, the cavernous and the supraclinoid segments are widely
patent. The right middle and the right anterior cerebral arteries
opacify normally into the capillary and the venous phases.

The right vertebral artery origin is normal.

The vessel is seen to opacify normally to the cranial skull base.

Normal opacification is seen of the prominent right posterior
inferior cerebellar artery.

The right vertebrobasilar junction, the opacified portions of the
basilar artery, the posterior cerebral arteries, the superior
cerebellar arteries and the anterior-inferior cerebellar arteries
are normal into the capillary and venous phases. Non-opacified blood
seen in the basilar artery from the contralateral vertebral artery.

The left common carotid arteriogram demonstrates the right external
carotid artery and its major branches to be widely patent.

The left internal carotid artery at the bulb to the cranial skull
base opacifies normally.

The petrous, the cavernous and the supraclinoid segments are widely
patent.

The left middle and the left anterior cerebral arteries opacify
normally into the capillary and the venous phases. The left
vertebral artery origin is normal.

The vessel is seen to opacify normally to the cranial skull base.

Normal opacification is seen of the left vertebrobasilar junction.
The left posterior inferior cerebellar artery is hypoplastic.

There is a left anterior inferior cerebral artery/posterior inferior
cerebellar artery complex noted.

The posterior cerebral arteries, the superior cerebellar arteries
and the anterior inferior cerebellar arteries opacify normally into
the capillary and venous phases. Non-opacified blood is seen in the
basilar artery from the contralateral vertebral artery extending
into the posterior cerebral arteries.
IMPRESSION: Angiographically no occlusions, stenoses, dissections or
arteriovenous shunting to suggest dural AV fistula or arteriovenous
malformation is noted.

No evidence of intracanal aneurysm.

Venous outflow within normal limits.

## 2017-02-11 ENCOUNTER — Telehealth: Payer: Self-pay | Admitting: Neurology

## 2017-04-08 ENCOUNTER — Ambulatory Visit (INDEPENDENT_AMBULATORY_CARE_PROVIDER_SITE_OTHER): Payer: BLUE CROSS/BLUE SHIELD | Admitting: Neurology

## 2017-04-08 ENCOUNTER — Encounter (INDEPENDENT_AMBULATORY_CARE_PROVIDER_SITE_OTHER): Payer: Self-pay

## 2017-04-08 ENCOUNTER — Encounter: Payer: Self-pay | Admitting: Neurology

## 2017-04-08 VITALS — BP 128/82 | HR 73 | Ht 68.0 in | Wt 201.8 lb

## 2017-04-08 DIAGNOSIS — E782 Mixed hyperlipidemia: Secondary | ICD-10-CM

## 2017-04-08 DIAGNOSIS — I613 Nontraumatic intracerebral hemorrhage in brain stem: Secondary | ICD-10-CM | POA: Diagnosis not present

## 2017-04-08 DIAGNOSIS — M5412 Radiculopathy, cervical region: Secondary | ICD-10-CM

## 2017-04-08 NOTE — Patient Instructions (Addendum)
-   continue zetia and lipitor for HLD - neck movement precautions - seek for suggestions from PT for neck exercise.  - Follow up with your primary care physician for stroke risk factor modification. Recommend maintain blood pressure goal <130/80, diabetes with hemoglobin A1c goal below 6.5% and lipids with LDL cholesterol goal below 70 mg/dL.  - check BP at home. - follow up as needed.

## 2017-04-08 NOTE — Progress Notes (Signed)
STROKE NEUROLOGY FOLLOW UP NOTE  NAME: Alan Dudley DOB: 08/05/1961  REASON FOR VISIT: stroke follow up HISTORY FROM: pt and chart  Today we had the pleasure of seeing Alan Dudley in follow-up at our [facility]rology [facility]. Pt was accompanied by no one.   History Summary Alan Dudley is a 56 y.o. 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 From the mild symptoms and the size of hemorrhage, it was speculated that the hemorrhage was likely venous bleeding, could be from cavernoma or DVA. He was treated with supportive care including blood pressure control and PT/OT. His BP controlled well without BP meds. LDL and TG was high and put on statin. He continued to have diplopia on discharge on 12/30/14. However, he was readmitted on 12/31/14 for right arm numbness, repeat CT no significant change. The symptoms were likely due to the edema surrounding the pontine lesion. He was then discharged on 01/02/15.  04/11/15 follow up - the patient has been doing well.  He followed up with OT for diplopia, and for the last one month, his diplopia had significant improvement. Currently on left gaze diplopia only occurs more laterally. His BP stable at home, today 123/87. He has back to work full time. He had MRI amd MRA repeat on 01/24/15 showed resolution of bleeding, likely DVA or cavernoma. However, also raised concern of possible feeder artery for AVM. Therefore, he had cerebral angio on 02/01/15 which ruled out AVM or other vessel abnormality. He is doing well with only complain of mildly weak erection during sexual intercourse. He has appointment with PCP next month.   09/02/15 follow up - the pt has been doing well. Double vision resolved. Only sometimes if too tired, he may have some mild double vision and left bottom of foot numbness. Complains of right eye  redness, likely due to dry eyes. Recommended eye drops. BP 128/81. Still on lipitor and zetia, no side effects.  Interval History During the interval time, pt has been doing well from stroke standpoint. In 01/2017, he had neck pain, HA at back of the head, radiating to the top of head, also had b/l shoulder pain and radiating to b/l arms. I talked with him over the phone and let him know this was not stroke but more cervical radiculopathy. After 3 weeks, symptoms resolved. Last week, he was working and had neck bending down hard, heard a pop sounds at neck, felt some neck pain and HA at back of the head, no shoulder or arm pain. Resolved the second day. BP 128/52.  REVIEW OF SYSTEMS: Full 14 system review of systems performed and notable only for those listed below and in HPI above, all others are negative:  Constitutional:   Cardiovascular:  Ear/Nose/Throat:   Skin:  Eyes:   Respiratory:   Gastroitestinal:   Genitourinary:  Hematology/Lymphatic:   Endocrine:  Musculoskeletal:  Neck pain Allergy/Immunology:   Neurological:   Psychiatric:  Sleep:   The following represents the patient's updated allergies and side effects list: Allergies  Allergen Reactions  . Bacitracin Other (See Comments)    childhood    The neurologically relevant items on the patient's problem list were reviewed on today's visit.  Neurologic Examination  A problem focused neurological exam (12 or more points of the single system neurologic examination, vital signs counts as 1 point, cranial nerves count for 8 points)  was performed.  Blood pressure 128/82, pulse 73, height 5\' 8"  (1.727 m), weight 201 lb 12.8 oz (91.5 kg).  General - Well nourished, well developed, in no apparent distress.  Ophthalmologic - Sharp disc margins OU.  Cardiovascular - Regular rate and rhythm with no murmur.  Mental Status -  Level of arousal and orientation to time, place, and person were intact. Language including  expression, naming, repetition, comprehension was assessed and found intact. Fund of Knowledge was assessed and was intact.  Cranial Nerves II - XII - II - Visual field intact OU. III, IV, VI - Extraocular movements intact. V - Facial sensation intact bilaterally. VII - Facial movement intact bilaterally. VIII - Hearing & vestibular intact bilaterally. X - Palate elevates symmetrically. XI - Chin turning & shoulder shrug intact bilaterally. XII - Tongue protrusion intact.  Motor Strength - The patient's strength was normal in all extremities and pronator drift was absent.  Bulk was normal and fasciculations were absent.   Motor Tone - Muscle tone was assessed at the neck and appendages and was normal.  Reflexes - The patient's reflexes were 1+ in all extremities and he had no pathological reflexes.  Sensory - Light touch, temperature/pinprick were assessed and were normal.    Coordination - The patient had normal movements in the hands and feet with no ataxia or dysmetria.  Tremor was absent.  Gait and Station - The patient's transfers, posture, gait, station, and turns were observed as normal.  Data reviewed: I personally reviewed the images and agree with the radiology interpretations.  Ct Head Wo Contrast 01/02/15 Evolving inferior pontine intraparenchymal hematoma, relatively unchanged. No obstructive hydrocephalus or new hemorrhage. 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  12/31/2014  1. No interval change in subacute left pontine medullary hemorrhage.  2 No new hemorrhage or cortical infarction identified.   12/30/2014  1. 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  2. No new intracranial abnormality.  12/28/2014  1. Stable left pontomedullary hemorrhage measuring 11 x 7.5 mm on axial imaging. 2. Chiari 1 malformation. 3. No significant interval  change.  Ct Angio Head and Neck W/cm &/or Wo Cm 12/27/2014 1 cm hemorrhage or hemorrhagic infarction at the left pontomedullary junction. No subarachnoid blood. No abnormal vascular structures seen in that region. CT angiography of the large and medium size vessels is normal.   Mr Laqueta Jean Wo Contrast  12/27/2014 IMPRESSION: 9 x 9 x 12 mm acute hemorrhage LEFT paramedian medulla. Marked susceptibility. Faint postcontrast enhancement. Favor brainstem hemorrhage related to an occult cerebral vascular malformation; see discussion above. Significant cerebellar tonsillar descent measuring 10-11 mm. 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  01/20/15 This is an abnormal MRI of the brain with and without contrast showing abnormal signal in the upper medulla and lower pons consistent with a developmental venous anomaly or cavernous malformation.. Since the prior MRI dated 12/27/2014, there has been signal changes consistent with the presence of subacute intracellular methemoglobin and evolution of hemorrhage associated with the AVM. 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   MRA of head 01/20/15 This is an abnormal MR angiogram a 9 x 10 mm focus in the upper medulla  and 95 mm focus in the lower pons. The medullary focus appears to have a possible feeder artery inferomedially. These likely represent a developmental venous anomaly or a cavernoma complex. Conventional angiography might better reveal the origin of this anomoly. Incidental note is also made of decreased flow in the distal right ACA.  Cerebral angio 02/01/15 Angiographically no occlusions, stenoses, dissections or arteriovenous shunting to suggest dural AV fistula or arteriovenous malformation  is noted No evidence of intracanal aneurysm. Venous outflow within normal limits.  2D Echocardiogram - LVEF 65-70%, moderate concentric LVH, normal wall motion, diastolic dysfunction, indeterminate LV filling pressure.  Component     Latest Ref Rng 12/28/2014  Cholesterol     0 - 200 mg/dL 740 (H)  Triglycerides     <150 mg/dL 814 (H)  HDL Cholesterol     >40 mg/dL 33 (L)  Total CHOL/HDL Ratio      7.0  VLDL     0 - 40 mg/dL UNABLE TO CALCULATE IF TRIGLYCERIDE OVER 400 mg/dL  LDL (calc)     0 - 99 mg/dL UNABLE TO CALCULATE IF TRIGLYCERIDE OVER 400 mg/dL  Hemoglobin G8J     4.8 - 5.6 % 5.7 (H)  Mean Plasma Glucose      117  TSH     0.350 - 4.500 uIU/mL 3.021  Vitamin B-12     180 - 914 pg/mL 267  Free T4     0.61 - 1.12 ng/dL 8.56   Component     Latest Ref Rng 12/29/2014  Cholesterol     0 - 200 mg/dL 314 (H)  Triglycerides     <150 mg/dL 970 (H)  HDL Cholesterol     >40 mg/dL 31 (L)  Total CHOL/HDL Ratio      7.3  VLDL     0 - 40 mg/dL UNABLE TO CALCULATE IF TRIGLYCERIDE OVER 400 mg/dL  LDL (calc)     0 - 99 mg/dL UNABLE TO CALCULATE IF TRIGLYCERIDE OVER 400 mg/dL  Hemoglobin Y6V     4.8 - 5.6 %   Mean Plasma Glucose        TSH     0.350 - 4.500 uIU/mL   Vitamin B-12     180 - 914 pg/mL   Free T4     0.61 - 1.12 ng/dL    Component     Latest Ref Rng 12/30/2014  Cholesterol     0 - 200 mg/dL 785 (H)  Triglycerides     <150 mg/dL 885 (H)  HDL Cholesterol     >40 mg/dL 32 (L)  Total CHOL/HDL Ratio      6.5  VLDL     0 - 40 mg/dL UNABLE TO CALCULATE IF TRIGLYCERIDE OVER 400 mg/dL  LDL (calc)     0 - 99 mg/dL UNABLE TO CALCULATE IF TRIGLYCERIDE OVER 400 mg/dL  Hemoglobin O2D     4.8 - 5.6 %   Mean Plasma Glucose        TSH     0.350 - 4.500 uIU/mL   Vitamin B-12     180 - 914 pg/mL   Free T4     0.61 - 1.12 ng/dL     Assessment: As you may recall, he is a 56 y.o. Caucasian male with PMH of tension HA and HLD admitted on 12/31/14 for left  pontomedullary intra-axial hemorrhage. From the mild symptoms and the size of hemorrhage, it was speculated that the hemorrhage was likely venous bleeding, could be  from cavernoma or DVA. He was treated with supportive care. LDL and TG was high and put on statin. Discharged on 12/30/14 but was readmitted on 12/31/14 for right arm numbness, repeat CT no significant change. The symptoms were likely due to the edema surrounding the pontine lesion. He was then discharged on 01/02/15. Repeat MRI and MRA showed resolution of bleeding but raised concern of AVM which was then ruled out by cerebral angio. Currently his left CN VI palsy has resolved. BP in good control without BP meds. Still on lipitor and zetia for HLD. Back to work full time. Had neck pain, cervical radiculopathy since 01/2017, intermittent flare x 2, resolved now.   Plan:  - continue zetia and lipitor for HLD - neck movement precautions - seek for suggestions from PT for neck exercise at home.  - Follow up with your primary care physician for stroke risk factor modification. Recommend maintain blood pressure goal <130/80, diabetes with hemoglobin A1c goal below 6.5% and lipids with LDL cholesterol goal below 70 mg/dL.  - check BP at home. - follow up as needed.   No orders of the defined types were placed in this encounter.   No orders of the defined types were placed in this encounter.   Patient Instructions  - continue zetia and lipitor for HLD - neck movement precautions - seek for suggestions from PT for neck exercise.  - Follow up with your primary care physician for stroke risk factor modification. Recommend maintain blood pressure goal <130/80, diabetes with hemoglobin A1c goal below 6.5% and lipids with LDL cholesterol goal below 70 mg/dL.  - check BP at home. - follow up as needed.   Marvel Plan, MD PhD [facility] [address], [address] [place], [place] [address] [phone]

## 2017-04-09 NOTE — Telephone Encounter (Signed)
Late entry:  Talked with pt over the phone on 02/11/17. Pt symptoms consistent with cervical radiculopathy. Answered all his questions. He expressed understanding and appreciation.   Marvel Plan, MD PhD Stroke Neurology 04/09/2017 9:42 PM

## 2021-11-22 DIAGNOSIS — G935 Compression of brain: Secondary | ICD-10-CM | POA: Diagnosis not present

## 2021-11-22 DIAGNOSIS — R972 Elevated prostate specific antigen [PSA]: Secondary | ICD-10-CM | POA: Diagnosis not present

## 2021-11-22 DIAGNOSIS — E559 Vitamin D deficiency, unspecified: Secondary | ICD-10-CM | POA: Diagnosis not present

## 2021-11-22 DIAGNOSIS — E782 Mixed hyperlipidemia: Secondary | ICD-10-CM | POA: Diagnosis not present

## 2021-11-22 DIAGNOSIS — E538 Deficiency of other specified B group vitamins: Secondary | ICD-10-CM | POA: Diagnosis not present

## 2021-11-22 DIAGNOSIS — Z0001 Encounter for general adult medical examination with abnormal findings: Secondary | ICD-10-CM | POA: Diagnosis not present

## 2021-11-22 DIAGNOSIS — R030 Elevated blood-pressure reading, without diagnosis of hypertension: Secondary | ICD-10-CM | POA: Diagnosis not present

## 2022-02-22 DIAGNOSIS — E669 Obesity, unspecified: Secondary | ICD-10-CM | POA: Diagnosis not present

## 2022-02-22 DIAGNOSIS — R030 Elevated blood-pressure reading, without diagnosis of hypertension: Secondary | ICD-10-CM | POA: Diagnosis not present

## 2022-04-25 DIAGNOSIS — R030 Elevated blood-pressure reading, without diagnosis of hypertension: Secondary | ICD-10-CM | POA: Diagnosis not present

## 2022-04-25 DIAGNOSIS — Z23 Encounter for immunization: Secondary | ICD-10-CM | POA: Diagnosis not present

## 2023-03-04 DIAGNOSIS — Z23 Encounter for immunization: Secondary | ICD-10-CM | POA: Diagnosis not present

## 2023-03-04 DIAGNOSIS — E782 Mixed hyperlipidemia: Secondary | ICD-10-CM | POA: Diagnosis not present

## 2023-03-04 DIAGNOSIS — E559 Vitamin D deficiency, unspecified: Secondary | ICD-10-CM | POA: Diagnosis not present

## 2023-03-04 DIAGNOSIS — Z0001 Encounter for general adult medical examination with abnormal findings: Secondary | ICD-10-CM | POA: Diagnosis not present

## 2023-03-04 DIAGNOSIS — Z125 Encounter for screening for malignant neoplasm of prostate: Secondary | ICD-10-CM | POA: Diagnosis not present

## 2023-03-04 DIAGNOSIS — Z79899 Other long term (current) drug therapy: Secondary | ICD-10-CM | POA: Diagnosis not present

## 2023-06-17 DIAGNOSIS — Z23 Encounter for immunization: Secondary | ICD-10-CM | POA: Diagnosis not present
